# Patient Record
Sex: Female | Born: 1965 | Race: Black or African American | Hispanic: No | Smoking: Never smoker
Health system: Southern US, Community
[De-identification: ages and names within clinical notes are randomized; demographics above are authoritative.]

## PROBLEM LIST (undated history)

## (undated) DIAGNOSIS — J45909 Unspecified asthma, uncomplicated: Secondary | ICD-10-CM

## (undated) DIAGNOSIS — C801 Malignant (primary) neoplasm, unspecified: Secondary | ICD-10-CM

## (undated) HISTORY — PX: BREAST LUMPECTOMY: SHX2

## (undated) HISTORY — PX: OTHER SURGICAL HISTORY: SHX169

---

## 2009-08-25 DIAGNOSIS — C801 Malignant (primary) neoplasm, unspecified: Secondary | ICD-10-CM

## 2009-08-25 HISTORY — DX: Malignant (primary) neoplasm, unspecified: C80.1

## 2013-10-31 ENCOUNTER — Ambulatory Visit: Payer: Self-pay | Admitting: Hematology and Oncology

## 2013-11-08 ENCOUNTER — Ambulatory Visit: Payer: Self-pay | Admitting: Hematology and Oncology

## 2013-11-08 LAB — COMPREHENSIVE METABOLIC PANEL
ALK PHOS: 49 U/L
ALT: 18 U/L (ref 12–78)
Albumin: 4 g/dL (ref 3.4–5.0)
Anion Gap: 8 (ref 7–16)
BUN: 16 mg/dL (ref 7–18)
Bilirubin,Total: 0.3 mg/dL (ref 0.2–1.0)
CALCIUM: 9.3 mg/dL (ref 8.5–10.1)
Chloride: 102 mmol/L (ref 98–107)
Co2: 31 mmol/L (ref 21–32)
Creatinine: 0.69 mg/dL (ref 0.60–1.30)
EGFR (African American): >60
EGFR (Non-African Amer.): >60
Glucose: 99 mg/dL (ref 65–99)
OSMOLALITY: 282 (ref 275–301)
POTASSIUM: 3.8 mmol/L (ref 3.5–5.1)
SGOT(AST): 18 U/L (ref 15–37)
Sodium: 141 mmol/L (ref 136–145)
TOTAL PROTEIN: 7.6 g/dL (ref 6.4–8.2)

## 2013-11-08 LAB — CBC CANCER CENTER
BASOS PCT: 1 %
Basophil #: 0.1 x10 3/mm (ref 0.0–0.1)
Eosinophil #: 0.2 x10 3/mm (ref 0.0–0.7)
Eosinophil %: 3.1 %
HCT: 36.5 % (ref 35.0–47.0)
HGB: 11.2 g/dL — ABNORMAL LOW (ref 12.0–16.0)
LYMPHS PCT: 52.7 %
Lymphocyte #: 3 x10 3/mm (ref 1.0–3.6)
MCH: 20 pg — ABNORMAL LOW (ref 26.0–34.0)
MCHC: 30.7 g/dL — AB (ref 32.0–36.0)
MCV: 65 fL — ABNORMAL LOW (ref 80–100)
Monocyte #: 0.4 x10 3/mm (ref 0.2–0.9)
Monocyte %: 7.3 %
NEUTROS PCT: 35.9 %
Neutrophil #: 2 x10 3/mm (ref 1.4–6.5)
Platelet: 302 x10 3/mm (ref 150–440)
RBC: 5.6 10*6/uL — AB (ref 3.80–5.20)
RDW: 14.9 % — ABNORMAL HIGH (ref 11.5–14.5)
WBC: 5.7 x10 3/mm (ref 3.6–11.0)

## 2013-11-08 LAB — IRON AND TIBC
IRON SATURATION: 34 %
IRON: 115 ug/dL (ref 50–170)
Iron Bind.Cap.(Total): 337 ug/dL (ref 250–450)
Unbound Iron-Bind.Cap.: 222 ug/dL

## 2013-11-08 LAB — LIPASE, BLOOD: LIPASE: 134 U/L (ref 73–393)

## 2013-11-08 LAB — FERRITIN: FERRITIN (ARMC): 65 ng/mL (ref 8–388)

## 2013-11-10 LAB — CANCER ANTIGEN 27.29: CA 27.29: 16.4 U/mL (ref 0.0–38.6)

## 2013-11-23 ENCOUNTER — Ambulatory Visit: Payer: Self-pay | Admitting: Hematology and Oncology

## 2013-12-23 ENCOUNTER — Ambulatory Visit: Payer: Self-pay | Admitting: Hematology and Oncology

## 2014-01-23 ENCOUNTER — Ambulatory Visit: Payer: Self-pay | Admitting: Hematology and Oncology

## 2014-06-29 ENCOUNTER — Ambulatory Visit: Payer: Self-pay | Admitting: Hematology and Oncology

## 2015-01-15 ENCOUNTER — Emergency Department (HOSPITAL_BASED_OUTPATIENT_CLINIC_OR_DEPARTMENT_OTHER)
Admission: EM | Admit: 2015-01-15 | Discharge: 2015-01-15 | Disposition: A | Discharge status: Home / Self Care | Attending: Emergency Medicine | Admitting: Emergency Medicine

## 2015-01-15 ENCOUNTER — Emergency Department (HOSPITAL_BASED_OUTPATIENT_CLINIC_OR_DEPARTMENT_OTHER)

## 2015-01-15 ENCOUNTER — Encounter (HOSPITAL_BASED_OUTPATIENT_CLINIC_OR_DEPARTMENT_OTHER): Payer: Self-pay | Admitting: *Deleted

## 2015-01-15 DIAGNOSIS — S50311A Abrasion of right elbow, initial encounter: Secondary | ICD-10-CM | POA: Diagnosis not present

## 2015-01-15 DIAGNOSIS — K5909 Other constipation: Secondary | ICD-10-CM | POA: Diagnosis not present

## 2015-01-15 DIAGNOSIS — Z853 Personal history of malignant neoplasm of breast: Secondary | ICD-10-CM | POA: Insufficient documentation

## 2015-01-15 DIAGNOSIS — S40211A Abrasion of right shoulder, initial encounter: Secondary | ICD-10-CM | POA: Diagnosis not present

## 2015-01-15 DIAGNOSIS — J45909 Unspecified asthma, uncomplicated: Secondary | ICD-10-CM | POA: Insufficient documentation

## 2015-01-15 DIAGNOSIS — Y92410 Unspecified street and highway as the place of occurrence of the external cause: Secondary | ICD-10-CM | POA: Diagnosis not present

## 2015-01-15 DIAGNOSIS — Z3202 Encounter for pregnancy test, result negative: Secondary | ICD-10-CM | POA: Diagnosis not present

## 2015-01-15 DIAGNOSIS — Y9389 Activity, other specified: Secondary | ICD-10-CM | POA: Diagnosis not present

## 2015-01-15 DIAGNOSIS — R1084 Generalized abdominal pain: Secondary | ICD-10-CM | POA: Diagnosis present

## 2015-01-15 DIAGNOSIS — Y998 Other external cause status: Secondary | ICD-10-CM | POA: Diagnosis not present

## 2015-01-15 DIAGNOSIS — R109 Unspecified abdominal pain: Secondary | ICD-10-CM

## 2015-01-15 HISTORY — DX: Unspecified asthma, uncomplicated: J45.909

## 2015-01-15 HISTORY — DX: Malignant (primary) neoplasm, unspecified: C80.1

## 2015-01-15 LAB — COMPREHENSIVE METABOLIC PANEL
ALT: 20 U/L (ref 14–54)
AST: 23 U/L (ref 15–41)
Albumin: 4.3 g/dL (ref 3.5–5.0)
Alkaline Phosphatase: 42 U/L (ref 38–126)
Anion gap: 9 (ref 5–15)
BUN: 13 mg/dL (ref 6–20)
CHLORIDE: 101 mmol/L (ref 101–111)
CO2: 29 mmol/L (ref 22–32)
CREATININE: 0.63 mg/dL (ref 0.44–1.00)
Calcium: 9.6 mg/dL (ref 8.9–10.3)
GFR calc Af Amer: >60 mL/min (ref >60)
GFR calc non Af Amer: >60 mL/min (ref >60)
GLUCOSE: 117 mg/dL — AB (ref 65–99)
Potassium: 3.5 mmol/L (ref 3.5–5.1)
Sodium: 139 mmol/L (ref 135–145)
Total Bilirubin: 0.9 mg/dL (ref 0.3–1.2)
Total Protein: 7 g/dL (ref 6.5–8.1)

## 2015-01-15 LAB — CBC WITH DIFFERENTIAL/PLATELET
BASOS PCT: 1 % (ref 0–1)
Basophils Absolute: 0 10*3/uL (ref 0.0–0.1)
Eosinophils Absolute: 0.3 10*3/uL (ref 0.0–0.7)
Eosinophils Relative: 6 % — ABNORMAL HIGH (ref 0–5)
HCT: 34 % — ABNORMAL LOW (ref 36.0–46.0)
HEMOGLOBIN: 11.2 g/dL — AB (ref 12.0–15.0)
LYMPHS PCT: 35 % (ref 12–46)
Lymphs Abs: 1.7 10*3/uL (ref 0.7–4.0)
MCH: 21.1 pg — ABNORMAL LOW (ref 26.0–34.0)
MCHC: 32.9 g/dL (ref 30.0–36.0)
MCV: 63.9 fL — ABNORMAL LOW (ref 78.0–100.0)
Monocytes Absolute: 0.3 10*3/uL (ref 0.1–1.0)
Monocytes Relative: 6 % (ref 3–12)
Neutro Abs: 2.5 10*3/uL (ref 1.7–7.7)
Neutrophils Relative %: 52 % (ref 43–77)
Platelets: 286 10*3/uL (ref 150–400)
RBC: 5.32 MIL/uL — ABNORMAL HIGH (ref 3.87–5.11)
RDW: 14.6 % (ref 11.5–15.5)
WBC: 4.8 10*3/uL (ref 4.0–10.5)

## 2015-01-15 LAB — PREGNANCY, URINE: Preg Test, Ur: NEGATIVE

## 2015-01-15 LAB — URINALYSIS, ROUTINE W REFLEX MICROSCOPIC
BILIRUBIN URINE: NEGATIVE
Glucose, UA: NEGATIVE mg/dL
Hgb urine dipstick: NEGATIVE
KETONES UR: NEGATIVE mg/dL
Leukocytes, UA: NEGATIVE
Nitrite: NEGATIVE
PH: 7 (ref 5.0–8.0)
PROTEIN: NEGATIVE mg/dL
Specific Gravity, Urine: 1.016 (ref 1.005–1.030)
Urobilinogen, UA: 0.2 mg/dL (ref 0.0–1.0)

## 2015-01-15 LAB — LIPASE, BLOOD: LIPASE: 28 U/L (ref 22–51)

## 2015-01-15 MED ORDER — IOHEXOL 300 MG/ML  SOLN
100.0000 mL | Freq: Once | INTRAMUSCULAR | Status: AC | PRN
  Administered 2015-01-15: 100 mL via INTRAVENOUS

## 2015-01-15 NOTE — ED Notes (Signed)
Pt reports abdominal cramping, nausea, HA and back pain since 11am today.  No distress noted in triage.

## 2015-01-15 NOTE — Discharge Instructions (Signed)
Follow up with your doctor as discussed for the findings suggested Abdominal Pain Many things can cause abdominal pain. Usually, abdominal pain is not caused by a disease and will improve without treatment. It can often be observed and treated at home. Your health care provider will do a physical exam and possibly order blood tests and X-rays to help determine the seriousness of your pain. However, in many cases, more time must pass before a clear cause of the pain can be found. Before that point, your health care provider may not know if you need more testing or further treatment. HOME CARE INSTRUCTIONS  Monitor your abdominal pain for any changes. The following actions may help to alleviate any discomfort you are experiencing:  Only take over-the-counter or prescription medicines as directed by your health care provider.  Do not take laxatives unless directed to do so by your health care provider.  Try a clear liquid diet (broth, tea, or water) as directed by your health care provider. Slowly move to a bland diet as tolerated. SEEK MEDICAL CARE IF:  You have unexplained abdominal pain.  You have abdominal pain associated with nausea or diarrhea.  You have pain when you urinate or have a bowel movement.  You experience abdominal pain that wakes you in the night.  You have abdominal pain that is worsened or improved by eating food.  You have abdominal pain that is worsened with eating fatty foods.  You have a fever. SEEK IMMEDIATE MEDICAL CARE IF:   Your pain does not go away within 2 hours.  You keep throwing up (vomiting).  Your pain is felt only in portions of the abdomen, such as the right side or the left lower portion of the abdomen.  You pass bloody or black tarry stools. MAKE SURE YOU:  Understand these instructions.   Will watch your condition.   Will get help right away if you are not doing well or get worse.  Document Released: 05/21/2005 Document Revised:  08/16/2013 Document Reviewed: 04/20/2013 [facility] Patient Information 2015 [place], [place]. This information is not intended to replace advice given to you by your health care provider. Make sure you discuss any questions you have with your health care provider.
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
## 2015-05-25 ENCOUNTER — Emergency Department: Admitting: Anesthesiology

## 2015-05-25 ENCOUNTER — Emergency Department

## 2015-05-25 ENCOUNTER — Encounter: Payer: Self-pay | Admitting: Emergency Medicine

## 2015-05-25 ENCOUNTER — Encounter
Admission: EM | Disposition: A | Discharge status: Home / Self Care | Payer: Self-pay | Source: Home / Self Care | Attending: Surgery

## 2015-05-25 ENCOUNTER — Inpatient Hospital Stay
Admission: EM | Admit: 2015-05-25 | Discharge: 2015-05-27 | DRG: 340 | Disposition: A | Discharge status: Home / Self Care | Attending: Surgery | Admitting: Surgery

## 2015-05-25 DIAGNOSIS — K353 Acute appendicitis with localized peritonitis, without perforation or gangrene: Secondary | ICD-10-CM

## 2015-05-25 DIAGNOSIS — D649 Anemia, unspecified: Secondary | ICD-10-CM | POA: Diagnosis present

## 2015-05-25 DIAGNOSIS — R102 Pelvic and perineal pain: Secondary | ICD-10-CM | POA: Diagnosis present

## 2015-05-25 DIAGNOSIS — K3580 Unspecified acute appendicitis: Secondary | ICD-10-CM

## 2015-05-25 DIAGNOSIS — Z853 Personal history of malignant neoplasm of breast: Secondary | ICD-10-CM | POA: Diagnosis not present

## 2015-05-25 DIAGNOSIS — J45909 Unspecified asthma, uncomplicated: Secondary | ICD-10-CM | POA: Diagnosis present

## 2015-05-25 DIAGNOSIS — Z923 Personal history of irradiation: Secondary | ICD-10-CM | POA: Diagnosis not present

## 2015-05-25 DIAGNOSIS — K3533 Acute appendicitis with perforation and localized peritonitis, with abscess: Secondary | ICD-10-CM | POA: Diagnosis present

## 2015-05-25 DIAGNOSIS — Z9221 Personal history of antineoplastic chemotherapy: Secondary | ICD-10-CM | POA: Diagnosis not present

## 2015-05-25 HISTORY — PX: LAPAROSCOPIC APPENDECTOMY: SHX408

## 2015-05-25 LAB — COMPREHENSIVE METABOLIC PANEL
ALT: 16 U/L (ref 14–54)
ANION GAP: 6 (ref 5–15)
AST: 19 U/L (ref 15–41)
Albumin: 3.4 g/dL — ABNORMAL LOW (ref 3.5–5.0)
Alkaline Phosphatase: 45 U/L (ref 38–126)
BUN: 13 mg/dL (ref 6–20)
CHLORIDE: 103 mmol/L (ref 101–111)
CO2: 28 mmol/L (ref 22–32)
CREATININE: 0.51 mg/dL (ref 0.44–1.00)
Calcium: 8.7 mg/dL — ABNORMAL LOW (ref 8.9–10.3)
Glucose, Bld: 92 mg/dL (ref 65–99)
Potassium: 3.9 mmol/L (ref 3.5–5.1)
Sodium: 137 mmol/L (ref 135–145)
Total Bilirubin: 0.6 mg/dL (ref 0.3–1.2)
Total Protein: 6.4 g/dL — ABNORMAL LOW (ref 6.5–8.1)

## 2015-05-25 LAB — CHLAMYDIA/NGC RT PCR (ARMC ONLY)
Chlamydia Tr: NOT DETECTED
N gonorrhoeae: NOT DETECTED

## 2015-05-25 LAB — URINALYSIS COMPLETE WITH MICROSCOPIC (ARMC ONLY)
BILIRUBIN URINE: NEGATIVE
GLUCOSE, UA: NEGATIVE mg/dL
HGB URINE DIPSTICK: NEGATIVE
Ketones, ur: NEGATIVE mg/dL
Leukocytes, UA: NEGATIVE
NITRITE: NEGATIVE
Protein, ur: 100 mg/dL — AB
Specific Gravity, Urine: 1.024 (ref 1.005–1.030)
pH: 8 (ref 5.0–8.0)

## 2015-05-25 LAB — WET PREP, GENITAL
Clue Cells Wet Prep HPF POC: NONE SEEN
TRICH WET PREP: NONE SEEN
Yeast Wet Prep HPF POC: NONE SEEN

## 2015-05-25 LAB — CBC
HCT: 32.8 % — ABNORMAL LOW (ref 35.0–47.0)
HCT: 31.1 % — ABNORMAL LOW (ref 35.0–47.0)
Hemoglobin: 10.4 g/dL — ABNORMAL LOW (ref 12.0–16.0)
Hemoglobin: 9.8 g/dL — ABNORMAL LOW (ref 12.0–16.0)
MCH: 21.1 pg — ABNORMAL LOW (ref 26.0–34.0)
MCH: 20.7 pg — AB (ref 26.0–34.0)
MCHC: 31.7 g/dL — AB (ref 32.0–36.0)
MCHC: 31.5 g/dL — ABNORMAL LOW (ref 32.0–36.0)
MCV: 66.6 fL — AB (ref 80.0–100.0)
MCV: 65.6 fL — AB (ref 80.0–100.0)
PLATELETS: 296 10*3/uL (ref 150–440)
Platelets: 324 10*3/uL (ref 150–440)
RBC: 4.92 MIL/uL (ref 3.80–5.20)
RBC: 4.74 MIL/uL (ref 3.80–5.20)
RDW: 14.5 % (ref 11.5–14.5)
RDW: 14.4 % (ref 11.5–14.5)
WBC: 10.8 10*3/uL (ref 3.6–11.0)
WBC: 12.7 10*3/uL — ABNORMAL HIGH (ref 3.6–11.0)

## 2015-05-25 LAB — CREATININE, SERUM
Creatinine, Ser: 0.54 mg/dL (ref 0.44–1.00)
GFR calc Af Amer: >60 mL/min (ref >60)
GFR calc non Af Amer: >60 mL/min (ref >60)

## 2015-05-25 LAB — LIPASE, BLOOD: Lipase: 20 U/L — ABNORMAL LOW (ref 22–51)

## 2015-05-25 LAB — POCT PREGNANCY, URINE: Preg Test, Ur: NEGATIVE

## 2015-05-25 SURGERY — APPENDECTOMY, LAPAROSCOPIC
Anesthesia: General | Wound class: Contaminated

## 2015-05-25 MED ORDER — LIDOCAINE HCL (CARDIAC) 20 MG/ML IV SOLN
INTRAVENOUS | Status: DC | PRN
  Administered 2015-05-25: 60 mg via INTRAVENOUS

## 2015-05-25 MED ORDER — BUPIVACAINE-EPINEPHRINE (PF) 0.25% -1:200000 IJ SOLN
INTRAMUSCULAR | Status: DC | PRN
  Administered 2015-05-25: 30 mL via PERINEURAL

## 2015-05-25 MED ORDER — LACTATED RINGERS IV SOLN
INTRAVENOUS | Status: DC
  Administered 2015-05-25: 15:00:00 via INTRAVENOUS

## 2015-05-25 MED ORDER — NEOSTIGMINE METHYLSULFATE 10 MG/10ML IV SOLN
INTRAVENOUS | Status: DC | PRN
  Administered 2015-05-25: 3 mg via INTRAVENOUS

## 2015-05-25 MED ORDER — DEXAMETHASONE SODIUM PHOSPHATE 10 MG/ML IJ SOLN
INTRAMUSCULAR | Status: DC | PRN
  Administered 2015-05-25: 5 mg via INTRAVENOUS

## 2015-05-25 MED ORDER — IOHEXOL 300 MG/ML  SOLN
100.0000 mL | Freq: Once | INTRAMUSCULAR | Status: AC | PRN
  Administered 2015-05-25: 100 mL via INTRAVENOUS

## 2015-05-25 MED ORDER — ONDANSETRON HCL 4 MG/2ML IJ SOLN: 4.0000 mg | Freq: Four times a day (QID) | INTRAMUSCULAR | Status: DC | PRN

## 2015-05-25 MED ORDER — HEPARIN SODIUM (PORCINE) 5000 UNIT/ML IJ SOLN: 5000.0000 [IU] | Freq: Three times a day (TID) | INTRAMUSCULAR | Status: DC

## 2015-05-25 MED ORDER — HYDROMORPHONE HCL 1 MG/ML IJ SOLN
0.2500 mg | INTRAMUSCULAR | Status: DC | PRN
  Administered 2015-05-25 (×2): 0.5 mg via INTRAVENOUS

## 2015-05-25 MED ORDER — ATROPINE SULFATE 0.4 MG/ML IJ SOLN
INTRAMUSCULAR | Status: DC | PRN
  Administered 2015-05-25: 0.2 mg via INTRAVENOUS

## 2015-05-25 MED ORDER — DEXTROSE 5 % IV SOLN
2.0000 g | Freq: Once | INTRAVENOUS | Status: AC
  Administered 2015-05-25: 2 g via INTRAVENOUS
  Filled 2015-05-25: qty 2

## 2015-05-25 MED ORDER — FENTANYL CITRATE (PF) 100 MCG/2ML IJ SOLN
INTRAMUSCULAR | Status: DC | PRN
  Administered 2015-05-25: 100 ug via INTRAVENOUS

## 2015-05-25 MED ORDER — HEPARIN SODIUM (PORCINE) 5000 UNIT/ML IJ SOLN
5000.0000 [IU] | Freq: Three times a day (TID) | INTRAMUSCULAR | Status: DC
  Administered 2015-05-25 – 2015-05-27 (×6): 5000 [IU] via SUBCUTANEOUS
  Filled 2015-05-25 (×5): qty 1

## 2015-05-25 MED ORDER — OXYCODONE HCL 5 MG PO TABS
5.0000 mg | ORAL_TABLET | ORAL | Status: DC | PRN
  Administered 2015-05-26 (×2): 5 mg via ORAL
  Filled 2015-05-25 (×2): qty 1

## 2015-05-25 MED ORDER — PROPOFOL 10 MG/ML IV BOLUS
INTRAVENOUS | Status: DC | PRN
  Administered 2015-05-25: 150 mg via INTRAVENOUS

## 2015-05-25 MED ORDER — DOXYCYCLINE HYCLATE 100 MG PO TABS
100.0000 mg | ORAL_TABLET | Freq: Two times a day (BID) | ORAL | Status: DC
  Administered 2015-05-25 – 2015-05-27 (×4): 100 mg via ORAL
  Filled 2015-05-25 (×4): qty 1

## 2015-05-25 MED ORDER — IOHEXOL 240 MG/ML SOLN
25.0000 mL | Freq: Once | INTRAMUSCULAR | Status: AC | PRN
Start: 2015-05-25 — End: 2015-05-25
  Administered 2015-05-25: 25 mL via ORAL

## 2015-05-25 MED ORDER — ONDANSETRON HCL 4 MG/2ML IJ SOLN
INTRAMUSCULAR | Status: DC | PRN
  Administered 2015-05-25: 4 mg via INTRAVENOUS

## 2015-05-25 MED ORDER — ONDANSETRON HCL 4 MG PO TABS: 4.0000 mg | ORAL_TABLET | Freq: Four times a day (QID) | ORAL | Status: DC | PRN

## 2015-05-25 MED ORDER — DEXTROSE 5 % IV SOLN
1.0000 g | Freq: Four times a day (QID) | INTRAVENOUS | Status: DC
  Administered 2015-05-25 – 2015-05-27 (×6): 1 g via INTRAVENOUS
  Filled 2015-05-25 (×11): qty 1

## 2015-05-25 MED ORDER — LACTATED RINGERS IV SOLN
INTRAVENOUS | Status: DC
  Administered 2015-05-25 – 2015-05-26 (×3): via INTRAVENOUS
  Administered 2015-05-26 – 2015-05-27 (×2): 1 mL via INTRAVENOUS

## 2015-05-25 MED ORDER — MORPHINE SULFATE (PF) 2 MG/ML IV SOLN
2.0000 mg | INTRAVENOUS | Status: DC | PRN
  Administered 2015-05-26 – 2015-05-27 (×3): 2 mg via INTRAVENOUS
  Filled 2015-05-25 (×3): qty 1

## 2015-05-25 MED ORDER — KETOROLAC TROMETHAMINE 30 MG/ML IJ SOLN
INTRAMUSCULAR | Status: DC | PRN
  Administered 2015-05-25: 30 mg via INTRAVENOUS

## 2015-05-25 MED ORDER — GLYCOPYRROLATE 0.2 MG/ML IJ SOLN
INTRAMUSCULAR | Status: DC | PRN
  Administered 2015-05-25: .6 mg via INTRAVENOUS
  Administered 2015-05-25: 0.2 mg via INTRAVENOUS

## 2015-05-25 MED ORDER — PROMETHAZINE HCL 25 MG/ML IJ SOLN: 6.2500 mg | INTRAMUSCULAR | Status: DC | PRN

## 2015-05-25 MED ORDER — MIDAZOLAM HCL 5 MG/5ML IJ SOLN
INTRAMUSCULAR | Status: DC | PRN
  Administered 2015-05-25: 2 mg via INTRAVENOUS

## 2015-05-25 MED ORDER — SUCCINYLCHOLINE CHLORIDE 20 MG/ML IJ SOLN
INTRAMUSCULAR | Status: DC | PRN
  Administered 2015-05-25: 100 mg via INTRAVENOUS

## 2015-05-25 MED ORDER — ROCURONIUM BROMIDE 100 MG/10ML IV SOLN
INTRAVENOUS | Status: DC | PRN
  Administered 2015-05-25: 25 mg via INTRAVENOUS
  Administered 2015-05-25: 5 mg via INTRAVENOUS

## 2015-05-25 MED ORDER — HYDROMORPHONE HCL 1 MG/ML IJ SOLN
INTRAMUSCULAR | Status: AC
  Filled 2015-05-25: qty 1

## 2015-05-25 MED ORDER — HEPARIN SODIUM (PORCINE) 5000 UNIT/ML IJ SOLN
INTRAMUSCULAR | Status: AC
Start: 2015-05-25 — End: 2015-05-26
  Filled 2015-05-25: qty 1

## 2015-05-25 MED ORDER — LACTATED RINGERS IV BOLUS (SEPSIS)
500.0000 mL | Freq: Once | INTRAVENOUS | Status: AC
  Administered 2015-05-25: 500 mL via INTRAVENOUS

## 2015-05-25 SURGICAL SUPPLY — 42 items
ADHESIVE MASTISOL STRL (MISCELLANEOUS) ×3 IMPLANT
APPLIER CLIP ROT 10 11.4 M/L (STAPLE) ×3
BLADE SURG SZ11 CARB STEEL (BLADE) IMPLANT
CANISTER SUCT 3000ML (MISCELLANEOUS) ×3 IMPLANT
CATH TRAY 16F METER LATEX (MISCELLANEOUS) ×3 IMPLANT
CHLORAPREP W/TINT 26ML (MISCELLANEOUS) ×3 IMPLANT
CLIP APPLIE ROT 10 11.4 M/L (STAPLE) ×1 IMPLANT
CLOSURE WOUND 1/2 X4 (GAUZE/BANDAGES/DRESSINGS) ×1
CUTTER LINEAR ENDO 35 ART THIN (STAPLE) ×3 IMPLANT
DECANTER SPIKE VIAL GLASS SM (MISCELLANEOUS) ×3 IMPLANT
DEVICE TROCAR PUNCTURE CLOSURE (ENDOMECHANICALS) ×3 IMPLANT
ENDOPOUCH RETRIEVER 10 (MISCELLANEOUS) ×3 IMPLANT
GAUZE SPONGE NON-WVN 2X2 STRL (MISCELLANEOUS) ×6 IMPLANT
GLOVE BIO SURGEON STRL SZ8 (GLOVE) ×6 IMPLANT
GOWN STRL REUS W/ TWL LRG LVL3 (GOWN DISPOSABLE) ×2 IMPLANT
GOWN STRL REUS W/TWL LRG LVL3 (GOWN DISPOSABLE) ×4
IRRIGATION STRYKERFLOW (MISCELLANEOUS) ×1 IMPLANT
IRRIGATOR STRYKERFLOW (MISCELLANEOUS) ×3
KIT RM TURNOVER STRD PROC AR (KITS) ×3 IMPLANT
LABEL OR SOLS (LABEL) IMPLANT
NDL SAFETY 22GX1.5 (NEEDLE) ×3 IMPLANT
NEEDLE VERESS 14GA 120MM (NEEDLE) ×3 IMPLANT
NS IRRIG 500ML POUR BTL (IV SOLUTION) ×3 IMPLANT
PACK LAP CHOLECYSTECTOMY (MISCELLANEOUS) ×3 IMPLANT
PAD GROUND ADULT SPLIT (MISCELLANEOUS) ×3 IMPLANT
RELOAD /EVU35 (ENDOMECHANICALS) IMPLANT
RELOAD CUTTER ETS 35MM STAND (ENDOMECHANICALS) ×3 IMPLANT
SCISSORS METZENBAUM CVD 33 (INSTRUMENTS) ×3 IMPLANT
SLEEVE ENDOPATH XCEL 5M (ENDOMECHANICALS) ×3 IMPLANT
SOL .9 NS 3000ML IRR  AL (IV SOLUTION) ×2
SOL .9 NS 3000ML IRR UROMATIC (IV SOLUTION) ×1 IMPLANT
SPONGE LAP 18X18 5 PK (GAUZE/BANDAGES/DRESSINGS) ×3 IMPLANT
SPONGE VERSALON 2X2 STRL (MISCELLANEOUS) ×12
STRIP CLOSURE SKIN 1/2X4 (GAUZE/BANDAGES/DRESSINGS) ×2 IMPLANT
SUT MNCRL 4-0 (SUTURE)
SUT MNCRL 4-0 27XMFL (SUTURE)
SUT VICRYL 0 TIES 12 18 (SUTURE) ×3 IMPLANT
SUTURE MNCRL 4-0 27XMF (SUTURE) IMPLANT
TRAP SPECIMEN MUCOUS 40CC (MISCELLANEOUS) IMPLANT
TROCAR XCEL 12X100 BLDLESS (ENDOMECHANICALS) ×3 IMPLANT
TROCAR XCEL NON-BLD 5MMX100MML (ENDOMECHANICALS) ×3 IMPLANT
TUBING INSUFFLATOR HI FLOW (MISCELLANEOUS) ×3 IMPLANT

## 2015-05-25 NOTE — ED Notes (Addendum)
Pt states she has been having lower abdominal pain for the past week.  Pain increased over night.  Reports having nausea.  Tender to touch.  Last BM 05/25/15. Denies any diarrhea. Nausea started yesterday along with headache. Has been eating and drinking. Patient reports port recently removed as well.

## 2015-05-25 NOTE — ED Notes (Signed)
Patient transported to CT 

## 2015-05-25 NOTE — ED Notes (Signed)
Urine preg negative

## 2015-05-25 NOTE — H&P (Signed)
Bridget Medina is an 49 y.o. female.    Chief Complaint: Right lower quadrant abdominal pain  HPI: This a patient with approximately 2 weeks of abdominal pain she states that it started getting bad last Sunday 5 or 6 days ago however she admits that it was bothering her for at least a week before that was quite minimal before Sunday. Spent the entire day in bed on Sunday he's been able to work this week. She has nausea but no emesis denies melena or hematochezia has had normal bowel movements without diarrhea and no fevers or chills she's never had an episode like this before denies dysuria.  Past Medical History  Diagnosis Date  . Asthma   . Cancer 2011    breast    Past Surgical History  Procedure Laterality Date  . Breast lumpectomy Right     Lumpectomy  . Ivad removal      History reviewed. No pertinent family history. Social History:  reports that she has never smoked. She does not have any smokeless tobacco history on file. She reports that she does not drink alcohol or use illicit drugs.  Allergies: No Known Allergies   (Not in a hospital admission)   Review of Systems  Constitutional: Negative for fever and chills.  HENT: Negative.   Eyes: Negative.   Respiratory: Negative.   Cardiovascular: Negative.   Gastrointestinal: Positive for nausea and abdominal pain. Negative for heartburn, vomiting, diarrhea, constipation, blood in stool and melena.  Genitourinary: Negative for dysuria, urgency and frequency.  Musculoskeletal: Negative.  Negative for myalgias.  Skin: Negative.   Neurological: Negative.  Negative for weakness.  Endo/Heme/Allergies: Negative.   Psychiatric/Behavioral: Negative.      Physical Exam:  BP 108/68 mmHg  Pulse 58  Temp(Src) 97.8 F (36.6 C) (Oral)  Resp 18  Ht 5' 4"  (1.626 m)  Wt 122 lb (55.339 kg)  BMI 20.93 kg/m2  SpO2 100%  Physical Exam  Constitutional: She is oriented to person, place, and time and well-developed,  well-nourished, and in no distress. No distress.  HENT:  Head: Normocephalic and atraumatic.  Eyes: No scleral icterus.  Neck: Normal range of motion. Neck supple.  Cardiovascular: Normal rate, regular rhythm and normal heart sounds.   Pulmonary/Chest: Effort normal and breath sounds normal. No respiratory distress. She has no wheezes. She has no rales.  Abdominal: She exhibits distension. There is tenderness. There is rebound and guarding.  Percussion tenderness in maximal tenderness in the right lower quadrant with a positive Rovsing sign  Musculoskeletal: Normal range of motion. She exhibits no edema.  Lymphadenopathy:    She has no cervical adenopathy.  Neurological: She is alert and oriented to person, place, and time.  Skin: Skin is warm and dry. No erythema.  Psychiatric: Mood, affect and judgment normal.        Results for orders placed or performed during the hospital encounter of 05/25/15 (from the past 48 hour(s))  CBC     Status: Abnormal   Collection Time: 05/25/15  7:55 AM  Result Value Ref Range   WBC 10.8 3.6 - 11.0 K/uL   RBC 4.92 3.80 - 5.20 MIL/uL   Hemoglobin 10.4 (L) 12.0 - 16.0 g/dL   HCT 32.8 (L) 35.0 - 47.0 %   MCV 66.6 (L) 80.0 - 100.0 fL   MCH 21.1 (L) 26.0 - 34.0 pg   MCHC 31.7 (L) 32.0 - 36.0 g/dL   RDW 14.5 11.5 - 14.5 %   Platelets 324 150 -  440 K/uL  Comprehensive metabolic panel     Status: Abnormal   Collection Time: 05/25/15  7:55 AM  Result Value Ref Range   Sodium 137 135 - 145 mmol/L   Potassium 3.9 3.5 - 5.1 mmol/L   Chloride 103 101 - 111 mmol/L   CO2 28 22 - 32 mmol/L   Glucose, Bld 92 65 - 99 mg/dL   BUN 13 6 - 20 mg/dL   Creatinine, Ser 0.51 0.44 - 1.00 mg/dL   Calcium 8.7 (L) 8.9 - 10.3 mg/dL   Total Protein 6.4 (L) 6.5 - 8.1 g/dL   Albumin 3.4 (L) 3.5 - 5.0 g/dL   AST 19 15 - 41 U/L   ALT 16 14 - 54 U/L   Alkaline Phosphatase 45 38 - 126 U/L   Total Bilirubin 0.6 0.3 - 1.2 mg/dL   GFR calc non Af Amer >60 >60 mL/min   GFR  calc Af Amer >60 >60 mL/min    Comment: (NOTE) The eGFR has been calculated using the CKD EPI equation. This calculation has not been validated in all clinical situations. eGFR's persistently <60 mL/min signify possible Chronic Kidney Disease.    Anion gap 6 5 - 15  Lipase, blood     Status: Abnormal   Collection Time: 05/25/15  7:55 AM  Result Value Ref Range   Lipase 20 (L) 22 - 51 U/L  Pregnancy, urine POC     Status: None   Collection Time: 05/25/15  8:11 AM  Result Value Ref Range   Preg Test, Ur NEGATIVE NEGATIVE    Comment:        THE SENSITIVITY OF THIS METHODOLOGY IS >24 mIU/mL   Urinalysis complete, with microscopic ([facility] only)     Status: Abnormal   Collection Time: 05/25/15  8:15 AM  Result Value Ref Range   Color, Urine YELLOW (A) YELLOW   APPearance CLEAR (A) CLEAR   Glucose, UA NEGATIVE NEGATIVE mg/dL   Bilirubin Urine NEGATIVE NEGATIVE   Ketones, ur NEGATIVE NEGATIVE mg/dL   Specific Gravity, Urine 1.024 1.005 - 1.030   Hgb urine dipstick NEGATIVE NEGATIVE   pH 8.0 5.0 - 8.0   Protein, ur 100 (A) NEGATIVE mg/dL   Nitrite NEGATIVE NEGATIVE   Leukocytes, UA NEGATIVE NEGATIVE   RBC / HPF 0-5 0 - 5 RBC/hpf   WBC, UA 0-5 0 - 5 WBC/hpf   Bacteria, UA RARE (A) NONE SEEN   Squamous Epithelial / LPF 0-5 (A) NONE SEEN   Mucous PRESENT    Hyaline Casts, UA PRESENT   Wet prep, genital     Status: Abnormal   Collection Time: 05/25/15 11:47 AM  Result Value Ref Range   Yeast Wet Prep HPF POC NONE SEEN NONE SEEN   Trich, Wet Prep NONE SEEN NONE SEEN   Clue Cells Wet Prep HPF POC NONE SEEN NONE SEEN   WBC, Wet Prep HPF POC FEW (A) NONE SEEN  Chlamydia/NGC rt PCR ([facility] only)     Status: None   Collection Time: 05/25/15 11:47 AM  Result Value Ref Range   Specimen source GC/Chlam CERVIX    Chlamydia Tr NOT DETECTED NOT DETECTED   N gonorrhoeae NOT DETECTED NOT DETECTED    Comment: (NOTE) 100  This methodology has not been evaluated in pregnant women or in 200   patients with a history of hysterectomy. 300 400  This methodology will not be performed on patients less than 34  years of age.    US  Transvaginal Non-ob  05/25/2015   CLINICAL DATA:  Pelvic pain for 1 week.  Nausea and tenderness.  EXAM: TRANSABDOMINAL AND TRANSVAGINAL ULTRASOUND OF PELVIS  TECHNIQUE: Both transabdominal and transvaginal ultrasound examinations of the pelvis were performed. Transabdominal technique was performed for global imaging of the pelvis including uterus, ovaries, adnexal regions, and pelvic cul-de-sac. It was necessary to proceed with endovaginal exam following the transabdominal exam to visualize the endometrial stripe and ovaries.  COMPARISON:  CT on 05/25/2015  FINDINGS: Uterus  Measurements: 10.0 x 6.7 x 6.0 cm. Retroverted. No fibroids or other mass visualized.  Endometrium  Thickness: 10 mm.  No focal abnormality visualized.  Right ovary  Measurements: No normal ovary visualized. A complex hypoechoic mass is seen in the right adnexa which measures approximately 7.8 x 4.6 x 6.8 cm. This shows areas of internal blood flow on color Doppler ultrasound.  Left ovary  Measurements: 3.0 x 2.0 x 2.8 cm. 2.3 cm follicle noted. Otherwise normal appearance/no adnexal mass.  Other findings  Small amount of free fluid noted.  IMPRESSION: Complex hypoechoic mass in right adnexa measures 7.8 cm. This has nonspecific features, but is suspicious for an inflammatory mass, with differential diagnosis including pelvic inflammatory disease and appendicitis. Neoplasm is considered much less likely based on recent CT appearance.  Normal appearance of uterus and left ovary.   Electronically Signed   By: Earle Gell M.D.   On: 05/25/2015 13:05   US Pelvis Complete  05/25/2015   CLINICAL DATA:  Pelvic pain for 1 week.  Nausea and tenderness.  EXAM: TRANSABDOMINAL AND TRANSVAGINAL ULTRASOUND OF PELVIS  TECHNIQUE: Both transabdominal and transvaginal ultrasound examinations of the pelvis were  performed. Transabdominal technique was performed for global imaging of the pelvis including uterus, ovaries, adnexal regions, and pelvic cul-de-sac. It was necessary to proceed with endovaginal exam following the transabdominal exam to visualize the endometrial stripe and ovaries.  COMPARISON:  CT on 05/25/2015  FINDINGS: Uterus  Measurements: 10.0 x 6.7 x 6.0 cm. Retroverted. No fibroids or other mass visualized.  Endometrium  Thickness: 10 mm.  No focal abnormality visualized.  Right ovary  Measurements: No normal ovary visualized. A complex hypoechoic mass is seen in the right adnexa which measures approximately 7.8 x 4.6 x 6.8 cm. This shows areas of internal blood flow on color Doppler ultrasound.  Left ovary  Measurements: 3.0 x 2.0 x 2.8 cm. 2.3 cm follicle noted. Otherwise normal appearance/no adnexal mass.  Other findings  Small amount of free fluid noted.  IMPRESSION: Complex hypoechoic mass in right adnexa measures 7.8 cm. This has nonspecific features, but is suspicious for an inflammatory mass, with differential diagnosis including pelvic inflammatory disease and appendicitis. Neoplasm is considered much less likely based on recent CT appearance.  Normal appearance of uterus and left ovary.   Electronically Signed   By: Earle Gell M.D.   On: 05/25/2015 13:05   Ct Abdomen Pelvis W Contrast  05/25/2015   CLINICAL DATA:  Worsening lower abdominal and pelvic pain for 1 week. Lower abdominal tenderness. Nausea.  EXAM: CT ABDOMEN AND PELVIS WITH CONTRAST  TECHNIQUE: Multidetector CT imaging of the abdomen and pelvis was performed using the standard protocol following bolus administration of intravenous contrast.  CONTRAST:  121m OMNIPAQUE IOHEXOL 300 MG/ML  SOLN  COMPARISON:  None.  FINDINGS: Lower chest:  No acute findings.  Hepatobiliary: No masses or other significant abnormality. Gallbladder is unremarkable.  Pancreas: No mass, inflammatory changes, or other significant abnormality.  Spleen: No  evidence of splenomegaly. Tiny less than 1 cm splenic lesions are seen which are too small to characterize but most likely benign.  Adrenals/Urinary Tract: No masses identified. No evidence of hydronephrosis.  Stomach/Bowel: No evidence of bowel obstruction. Wall thickening is seen involving the sigmoid colon, which is likely reactive in etiology due to right pelvic inflammatory process described below.  Vascular/Lymphatic: No pathologically enlarged lymph nodes. No evidence of abdominal aortic aneurysm.  Reproductive: No mass or other significant abnormality.  Other: Inflammatory changes and mild mesenteric lymphadenopathy is seen in right lower quadrant and right adnexa. This obscures normal anatomic landmarks. The appendix is difficult to visualize, but there is a somewhat linear soft tissue density with a tiny calcification on image 57/series 2 which is suspicious for an appendicolith. There is a small rim enhancing fluid collection in the right adnexa measuring 2.5 cm. Differential diagnosis include ruptured appendicitis, and pelvic inflammatory disease with tubo-ovarian abscess.  Musculoskeletal:  No suspicious bone lesions identified.  IMPRESSION: Right lower quadrant and right adnexal inflammatory process, with 2.5 cm rim enhancing fluid collection. Appendix is not well visualized but there is a possible tiny appendicular left in the area of inflammatory changes. Differential diagnosis includes ruptured appendicitis with small abscess, and pelvic inflammatory disease with small tubo-ovarian abscess.   Electronically Signed   By: Earle Gell M.D.   On: 05/25/2015 11:03     Assessment/Plan  CT ultrasound personally reviewed as were the labs history is consistent with a possible ruptured appendix with nearly 2 weeks of right lower quadrant pain worsening 5 days ago and CT scan shows a flow enhancing fluid collection in the right lower quadrant the appendix somewhat disappears near this section. With that  in mind of recommended diagnostic laparoscopy and while this could be a TOA I would recommend laparoscopy and irrigation and appendectomy regardless the rationale for this approach was discussed with her the options of observation were reviewed and the risk bleeding infection recurrence abscess conversion to an open procedure were all reviewed with her she understood and agreed to proceed [facility] Lawrence, MD, FACS

## 2015-05-25 NOTE — Progress Notes (Signed)
Addendum to the H&P. Patient has personal history of breast cancer on the left which was treated with breast conservation therapy radiation chemotherapy she is at risk for DVT and pulmonary embolus or 4 heparin and SCDs will be utilized.

## 2015-05-25 NOTE — ED Provider Notes (Signed)
[facility] Emergency Department Provider Note  ____________________________________________  Time seen: On arrival  I have reviewed the triage vital signs and the nursing notes.   HISTORY  Chief Complaint Abdominal Pain    HPI Bridget Medina is a 49 y.o. female who presents with complaints of lower abdominal pain for approximately one week. She reports it is cramping in nature. She reports is been getting steadily worse. She does note that she was able to run 8 miles 3 days ago without discomfort but it seems to have worsened since then. She does report a fullness when urinating. She is never had a urinary tract infection before. She denies vaginal discharge. She has had some nausea but no vomiting. No fevers no chills. Normal bowel movements     Past Medical History  Diagnosis Date  . Asthma   . Cancer 2011    breast    There are no active problems to display for this patient.   Past Surgical History  Procedure Laterality Date  . Breast lumpectomy Right     Lumpectomy  . Ivad removal      Current Outpatient Rx  Name  Route  Sig  Dispense  Refill  . albuterol (PROVENTIL HFA;VENTOLIN HFA) 108 (90 BASE) MCG/ACT inhaler   Inhalation   Inhale 2 puffs into the lungs every 4 (four) hours as needed for wheezing or shortness of breath.         Marland Kitchen FLUoxetine (PROZAC) 20 MG/5ML solution   Oral   Take 20 mg by mouth daily.           Allergies Review of patient's allergies indicates no known allergies.  History reviewed. No pertinent family history.  Social History Social History  Substance Use Topics  . Smoking status: Never Smoker   . Smokeless tobacco: None  . Alcohol Use: No    Review of Systems  Constitutional: Negative for fever. Eyes: Negative for visual changes. ENT: Negative for sore throat Cardiovascular: Negative for chest pain. Respiratory: Negative for shortness of breath. Gastrointestinal: Positive for abdominal pain  and nausea Genitourinary: Negative for dysuria. Musculoskeletal: Negative for back pain. Skin: Negative for rash. Neurological: Negative for headaches or focal weakness Psychiatric: No anxiety    ____________________________________________   PHYSICAL EXAM:  VITAL SIGNS: ED Triage Vitals  Enc Vitals Group     BP 05/25/15 0710 97/58 mmHg     Pulse Rate 05/25/15 0710 64     Resp 05/25/15 0710 18     Temp 05/25/15 0710 97.8 F (36.6 C)     Temp Source 05/25/15 0710 Oral     SpO2 05/25/15 0710 100 %     Weight 05/25/15 0710 122 lb (55.339 kg)     Height 05/25/15 0710 5\' 4"  (1.626 m)     Head Cir --      Peak Flow --      Pain Score 05/25/15 0800 10     Pain Loc --      Pain Edu? --      Excl. in [place]? --      Constitutional: Alert and oriented. Well appearing and in no distress. Eyes: Conjunctivae are normal.  ENT   Head: Normocephalic and atraumatic.   Mouth/Throat: Mucous membranes are moist. Cardiovascular: Normal rate, regular rhythm. Normal and symmetric distal pulses are present in all extremities. No murmurs, rubs, or gallops. Respiratory: Normal respiratory effort without tachypnea nor retractions. Breath sounds are clear and equal bilaterally.  Gastrointestinal: Moderate tenderness to palpation  suprapubically and in the lower quadrants bilaterally. No distention. There is no CVA tenderness. Genitourinary: No CMT, very little white discharge Musculoskeletal: Nontender with normal range of motion in all extremities. No lower extremity tenderness nor edema. Neurologic:  Normal speech and language. No gross focal neurologic deficits are appreciated. Skin:  Skin is warm, dry and intact. No rash noted. Psychiatric: Mood and affect are normal. Patient exhibits appropriate insight and judgment.  ____________________________________________    LABS (pertinent positives/negatives)  Labs Reviewed  CBC - Abnormal; Notable for the following:    Hemoglobin 10.4 (*)     HCT 32.8 (*)    MCV 66.6 (*)    MCH 21.1 (*)    MCHC 31.7 (*)    All other components within normal limits  COMPREHENSIVE METABOLIC PANEL  LIPASE, BLOOD  URINALYSIS COMPLETEWITH MICROSCOPIC ([facility] ONLY)    ____________________________________________   EKG  None  ____________________________________________    RADIOLOGY I have personally reviewed any xrays that were ordered on this patient: CT scan of the pelvis shows possible appendicitis versus TOA  ____________________________________________   PROCEDURES  Procedure(s) performed: none  Critical Care performed: none  ____________________________________________   INITIAL IMPRESSION / ASSESSMENT AND PLAN / ED COURSE  Pertinent labs & imaging results that were available during my care of the patient were reviewed by me and considered in my medical decision making (see chart for details).  Initial impression is suspicious for urinary tract infection. We will check labs, urine and reevaluate  Patient's labs and urine are unremarkable. Given her significant tenderness palpation we will order a CT abdomen pelvis.  CT of the pelvis concerning for appendicitis versus TOA. She has no CMT on pelvic exam and only a few white blood cells on wet prep we will also obtain an ultrasound  Ultrasound is not acutely helpful in elucidating the diagnosis.  I have consulted Dr. Burt Knack of Gen. surgery who has seen the patient in the ED and feels this is likely appendicitis and will admit the patient  ____________________________________________   FINAL CLINICAL IMPRESSION(S) / ED DIAGNOSES  Final diagnoses:  Pelvic pain in female  Acute appendicitis with localized peritonitis     Lavonia Drafts, MD 05/25/15 1348

## 2015-05-25 NOTE — ED Notes (Signed)
TO US via stretcher.  AAOx3.  Skin warm and dry.  

## 2015-05-25 NOTE — Anesthesia Procedure Notes (Signed)
Procedure Name: Intubation Date/Time: 05/25/2015 2:44 PM Performed by: Dionne Bucy Pre-anesthesia Checklist: Patient identified, Patient being monitored, Timeout performed, Emergency Drugs available and Suction available Patient Re-evaluated:Patient Re-evaluated prior to inductionOxygen Delivery Method: Circle system utilized Preoxygenation: Pre-oxygenation with 100% oxygen Intubation Type: IV induction and Rapid sequence Laryngoscope Size: Mac and 3 Grade View: Grade I Tube type: Oral Tube size: 7.0 mm Number of attempts: 1 Airway Equipment and Method: Stylet Placement Confirmation: ETT inserted through vocal cords under direct vision,  positive ETCO2 and breath sounds checked- equal and bilateral Secured at: 21 cm Tube secured with: Tape Dental Injury: Teeth and Oropharynx as per pre-operative assessment

## 2015-05-25 NOTE — Anesthesia Preprocedure Evaluation (Addendum)
Anesthesia Evaluation  Patient identified by MRN, date of birth, ID band Patient awake    Reviewed: Allergy & Precautions, NPO status , Patient's Chart, lab work & pertinent test results  Airway Mallampati: I  TM Distance: >3 FB Neck ROM: Full    Dental  (+) Teeth Intact   Pulmonary asthma ,    Pulmonary exam normal        Cardiovascular Exercise Tolerance: Good Normal cardiovascular exam     Neuro/Psych    GI/Hepatic Two week hx of abdominal pain. Nausea, no vx. Presumed appendicitis.    Endo/Other    Renal/GU      Musculoskeletal   Abdominal   Peds  Hematology  (+) anemia , Hb 10.4.   Anesthesia Other Findings Right breast lumpectomy 2012--NO axillary dissection.  Reproductive/Obstetrics                            Anesthesia Physical Anesthesia Plan  ASA: II and emergent  Anesthesia Plan: General   Post-op Pain Management:    Induction: Intravenous and Rapid sequence  Airway Management Planned: Oral ETT  Additional Equipment:   Intra-op Plan:   Post-operative Plan: Extubation in OR  Informed Consent: I have reviewed the patients History and Physical, chart, labs and discussed the procedure including the risks, benefits and alternatives for the proposed anesthesia with the patient or authorized representative who has indicated his/her understanding and acceptance.     Plan Discussed with: CRNA  Anesthesia Plan Comments:         Anesthesia Quick Evaluation

## 2015-05-25 NOTE — ED Notes (Signed)
CT notified pt completed oral contrast.

## 2015-05-25 NOTE — Transfer of Care (Signed)
Immediate Anesthesia Transfer of Care Note  Patient: Bridget Medina  Procedure(s) Performed: Procedure(s): APPENDECTOMY LAPAROSCOPIC (N/A)  Patient Location: PACU  Anesthesia Type:General  Level of Consciousness: sedated  Airway & Oxygen Therapy: Patient Spontanous Breathing and Patient connected to face mask oxygen  Post-op Assessment: Report given to RN and Post -op Vital signs reviewed and stable  Post vital signs: Reviewed and stable  Last Vitals:  Filed Vitals:   05/25/15 1535  BP: 103/62  Pulse: 59  Temp: 37.2 C  Resp: 16    Complications: No apparent anesthesia complications

## 2015-05-25 NOTE — Op Note (Signed)
laparascopic appendectomy   Toniann Ket Date of operation:  05/25/2015  Indications: The patient presented with a history of  abdominal pain. Workup has revealed findings consistent with acute appendicitis.  Pre-operative Diagnosis: Ruptured appendix Post-operative Diagnosis: Ruptured appendix with pelvic side wall abscess   Surgeon: Jerrol Banana. Burt Knack, MD, FACS  Anesthesia: General with endotracheal tube  Procedure Details  The patient was seen again in the preop area. The options of surgery versus observation were reviewed with the patient and/or family. The risks of bleeding, infection, recurrence of symptoms, negative laparoscopy, potential for an open procedure, bowel injury, abscess or infection, were all reviewed as well. The patient was taken to Operating Room, identified as Bridget Medina and the procedure verified as laparoscopic appendectomy. A Time Out was held and the above information confirmed.  The patient was placed in the supine position and general anesthesia was induced.  Antibiotic prophylaxis was administered and VT E prophylaxis was in place. A Foley catheter was placed by the nursing staff.   The abdomen was prepped and draped in a sterile fashion. An infraumbilical incision was made. A Veress needle was placed and pneumoperitoneum was obtained. A 5 mm trocar port was placed without difficulty and the abdominal cavity was explored.  Under direct vision a 5 mm suprapubic port was placed and a 13 mm left lateral port was placed all under direct vision.  The appendix was identified and found to be acutely inflamed. Tracing the appendix from the cecum down to the right pelvic sidewall the serosa was stripped of the appendix and opening of an densely adhesed and acutely inflamed area on the pelvic sidewall resulted in several cc of purulent material exuding from that area the tip of the appendix was elevated and then the base of the appendix   was dissected out and divided  with a standard load Endo GIA. The mesoappendix was divided with a vascular load Endo GIA. The appendix was passed out through the left lateral port site with the aid of an Endo Catch bag. The right lower quadrant and pelvis was then irrigated with copious amounts of normal saline which was aspirated. Inspection  failed to identify any additional bleeding and there were no signs of bowel injury. Therefore the left lateral port site was closed under direct vision utilizing an Endo Close technique with 0 Vicryl interrupted sutures, all under direct vision.   Again the right lower quadrant was inspected there was no sign of bleeding or bowel injury therefore pneumoperitoneum was released, all ports were removed and the skin incisions were approximated with subcuticular 4-0 Monocryl. Steri-Strips and Mastisol and sterile dressings were placed.  The patient tolerated the procedure well, there were no complications. The sponge lap and needle count were correct at the end of the procedure.  The patient was taken to the recovery room in stable condition to be admitted for continued care.  Findings: Acute ruptured appendicitis with pelvic wall abscess  Estimated Blood Loss: Minimal                  Specimens: appendix         Complications:  None                  Loghan Kurtzman E. Burt Knack MD, FACS

## 2015-05-25 NOTE — Progress Notes (Signed)
BP staying in high 80's in [place].  Reported to Dr. Andree Elk.  500 ml bolus ordered and started.

## 2015-05-25 NOTE — ED Notes (Signed)
Patient transported to Ultrasound 

## 2015-05-26 NOTE — Progress Notes (Signed)
1 Day Post-Op  Subjective: Status post laparoscopic appendectomy for ruptured appendix with pelvic sidewall abscess. Patient feels much better does have some minimal incisional pain. She is tolerating a clear liquid diet.  Objective: Vital signs in last 24 hours: Temp:  [97.8 F (36.6 C)-99 F (37.2 C)] 98.4 F (36.9 C) (10/01 0659) Pulse Rate:  [46-58] 50 (10/01 0659) Resp:  [16-24] 16 (10/01 0659) BP: (82-119)/(46-86) 98/50 mmHg (10/01 0659) SpO2:  [98 %-100 %] 100 % (10/01 0659) Last BM Date: 05/25/15  Intake/Output from previous day: 09/30 0701 - 10/01 0700 In: 2873.1 [I.V.:2873.1] Out: 55 [Urine:790; Blood:20] Intake/Output this shift: Total I/O In: 366.7 [I.V.:366.7] Out: 0   Physical exam:  Abdomen is soft and minimally tender wounds are dressed. Calves are nontender.  Lab Results: CBC   Recent Labs  05/25/15 0755 05/25/15 1741  WBC 10.8 12.7*  HGB 10.4* 9.8*  HCT 32.8* 31.1*  PLT 324 296   BMET  Recent Labs  05/25/15 0755 05/25/15 1741  NA 137  --   K 3.9  --   CL 103  --   CO2 28  --   GLUCOSE 92  --   BUN 13  --   CREATININE 0.51 0.54  CALCIUM 8.7*  --    PT/INR No results for input(s): LABPROT, INR in the last 72 hours. ABG No results for input(s): PHART, HCO3 in the last 72 hours.  Invalid input(s): PCO2, PO2  Studies/Results: US Transvaginal Non-ob  05/25/2015   CLINICAL DATA:  Pelvic pain for 1 week.  Nausea and tenderness.  EXAM: TRANSABDOMINAL AND TRANSVAGINAL ULTRASOUND OF PELVIS  TECHNIQUE: Both transabdominal and transvaginal ultrasound examinations of the pelvis were performed. Transabdominal technique was performed for global imaging of the pelvis including uterus, ovaries, adnexal regions, and pelvic cul-de-sac. It was necessary to proceed with endovaginal exam following the transabdominal exam to visualize the endometrial stripe and ovaries.  COMPARISON:  CT on 05/25/2015  FINDINGS: Uterus  Measurements: 10.0 x 6.7 x 6.0 cm.  Retroverted. No fibroids or other mass visualized.  Endometrium  Thickness: 10 mm.  No focal abnormality visualized.  Right ovary  Measurements: No normal ovary visualized. A complex hypoechoic mass is seen in the right adnexa which measures approximately 7.8 x 4.6 x 6.8 cm. This shows areas of internal blood flow on color Doppler ultrasound.  Left ovary  Measurements: 3.0 x 2.0 x 2.8 cm. 2.3 cm follicle noted. Otherwise normal appearance/no adnexal mass.  Other findings  Small amount of free fluid noted.  IMPRESSION: Complex hypoechoic mass in right adnexa measures 7.8 cm. This has nonspecific features, but is suspicious for an inflammatory mass, with differential diagnosis including pelvic inflammatory disease and appendicitis. Neoplasm is considered much less likely based on recent CT appearance.  Normal appearance of uterus and left ovary.   Electronically Signed   By: Earle Gell M.D.   On: 05/25/2015 13:05   US Pelvis Complete  05/25/2015   CLINICAL DATA:  Pelvic pain for 1 week.  Nausea and tenderness.  EXAM: TRANSABDOMINAL AND TRANSVAGINAL ULTRASOUND OF PELVIS  TECHNIQUE: Both transabdominal and transvaginal ultrasound examinations of the pelvis were performed. Transabdominal technique was performed for global imaging of the pelvis including uterus, ovaries, adnexal regions, and pelvic cul-de-sac. It was necessary to proceed with endovaginal exam following the transabdominal exam to visualize the endometrial stripe and ovaries.  COMPARISON:  CT on 05/25/2015  FINDINGS: Uterus  Measurements: 10.0 x 6.7 x 6.0 cm. Retroverted. No fibroids or other  mass visualized.  Endometrium  Thickness: 10 mm.  No focal abnormality visualized.  Right ovary  Measurements: No normal ovary visualized. A complex hypoechoic mass is seen in the right adnexa which measures approximately 7.8 x 4.6 x 6.8 cm. This shows areas of internal blood flow on color Doppler ultrasound.  Left ovary  Measurements: 3.0 x 2.0 x 2.8 cm. 2.3 cm  follicle noted. Otherwise normal appearance/no adnexal mass.  Other findings  Small amount of free fluid noted.  IMPRESSION: Complex hypoechoic mass in right adnexa measures 7.8 cm. This has nonspecific features, but is suspicious for an inflammatory mass, with differential diagnosis including pelvic inflammatory disease and appendicitis. Neoplasm is considered much less likely based on recent CT appearance.  Normal appearance of uterus and left ovary.   Electronically Signed   By: Earle Gell M.D.   On: 05/25/2015 13:05   Ct Abdomen Pelvis W Contrast  05/25/2015   CLINICAL DATA:  Worsening lower abdominal and pelvic pain for 1 week. Lower abdominal tenderness. Nausea.  EXAM: CT ABDOMEN AND PELVIS WITH CONTRAST  TECHNIQUE: Multidetector CT imaging of the abdomen and pelvis was performed using the standard protocol following bolus administration of intravenous contrast.  CONTRAST:  19mL OMNIPAQUE IOHEXOL 300 MG/ML  SOLN  COMPARISON:  None.  FINDINGS: Lower chest:  No acute findings.  Hepatobiliary: No masses or other significant abnormality. Gallbladder is unremarkable.  Pancreas: No mass, inflammatory changes, or other significant abnormality.  Spleen: No evidence of splenomegaly. Tiny less than 1 cm splenic lesions are seen which are too small to characterize but most likely benign.  Adrenals/Urinary Tract: No masses identified. No evidence of hydronephrosis.  Stomach/Bowel: No evidence of bowel obstruction. Wall thickening is seen involving the sigmoid colon, which is likely reactive in etiology due to right pelvic inflammatory process described below.  Vascular/Lymphatic: No pathologically enlarged lymph nodes. No evidence of abdominal aortic aneurysm.  Reproductive: No mass or other significant abnormality.  Other: Inflammatory changes and mild mesenteric lymphadenopathy is seen in right lower quadrant and right adnexa. This obscures normal anatomic landmarks. The appendix is difficult to visualize, but  there is a somewhat linear soft tissue density with a tiny calcification on image 57/series 2 which is suspicious for an appendicolith. There is a small rim enhancing fluid collection in the right adnexa measuring 2.5 cm. Differential diagnosis include ruptured appendicitis, and pelvic inflammatory disease with tubo-ovarian abscess.  Musculoskeletal:  No suspicious bone lesions identified.  IMPRESSION: Right lower quadrant and right adnexal inflammatory process, with 2.5 cm rim enhancing fluid collection. Appendix is not well visualized but there is a possible tiny appendicular left in the area of inflammatory changes. Differential diagnosis includes ruptured appendicitis with small abscess, and pelvic inflammatory disease with small tubo-ovarian abscess.   Electronically Signed   By: Earle Gell M.D.   On: 05/25/2015 11:03    Anti-infectives: Anti-infectives    Start     Dose/Rate Route Frequency Ordered Stop   05/25/15 2100  cefOXitin (MEFOXIN) 1 g in dextrose 5 % 50 mL IVPB     1 g 100 mL/hr over 30 Minutes Intravenous 4 times per day 05/25/15 1534     05/25/15 1545  doxycycline (VIBRA-TABS) tablet 100 mg     100 mg Oral Every 12 hours 05/25/15 1534     05/25/15 1400  cefOXitin (MEFOXIN) 2 g in dextrose 5 % 50 mL IVPB     2 g 100 mL/hr over 30 Minutes Intravenous  Once 05/25/15 1348 05/25/15  1522      Assessment/Plan: s/p Procedure(s): APPENDECTOMY LAPAROSCOPIC   Recommend continuing IV anabiotic's and probably home tomorrow on oral antibiotic's will advance diet today and saline lock.  Florene Glen, MD, FACS  05/26/2015

## 2015-05-27 LAB — CBC WITH DIFFERENTIAL/PLATELET
BASOS PCT: 1 %
Basophils Absolute: 0 10*3/uL (ref 0–0.1)
EOS ABS: 0.1 10*3/uL (ref 0–0.7)
EOS PCT: 1 %
HCT: 28.5 % — ABNORMAL LOW (ref 35.0–47.0)
Hemoglobin: 9 g/dL — ABNORMAL LOW (ref 12.0–16.0)
Lymphocytes Relative: 21 %
Lymphs Abs: 2 10*3/uL (ref 1.0–3.6)
MCH: 21.2 pg — AB (ref 26.0–34.0)
MCHC: 31.7 g/dL — AB (ref 32.0–36.0)
MCV: 67 fL — ABNORMAL LOW (ref 80.0–100.0)
MONO ABS: 1 10*3/uL — AB (ref 0.2–0.9)
MONOS PCT: 11 %
NEUTROS PCT: 66 %
Neutro Abs: 6.3 10*3/uL (ref 1.4–6.5)
PLATELETS: 288 10*3/uL (ref 150–440)
RBC: 4.26 MIL/uL (ref 3.80–5.20)
RDW: 14.5 % (ref 11.5–14.5)
WBC: 9.5 10*3/uL (ref 3.6–11.0)

## 2015-05-27 MED ORDER — CIPROFLOXACIN HCL 500 MG PO TABS: 500.0000 mg | ORAL_TABLET | Freq: Two times a day (BID) | ORAL | Status: DC

## 2015-05-27 MED ORDER — OXYCODONE HCL 5 MG PO TABS: 5.0000 mg | ORAL_TABLET | ORAL | Status: DC | PRN

## 2015-05-27 MED ORDER — METRONIDAZOLE 500 MG PO TABS: 500.0000 mg | ORAL_TABLET | Freq: Three times a day (TID) | ORAL | Status: DC

## 2015-05-27 MED ORDER — DOXYCYCLINE HYCLATE 100 MG PO TABS: 100.0000 mg | ORAL_TABLET | Freq: Two times a day (BID) | ORAL | Status: DC

## 2015-05-27 NOTE — Progress Notes (Signed)
2 Days Post-Op  Subjective: Patient feels better mild nausea no fevers or chills tolerating a diet.  Objective: Vital signs in last 24 hours: Temp:  [98.1 F (36.7 C)-99.2 F (37.3 C)] 98.1 F (36.7 C) (10/02 0419) Pulse Rate:  [48-99] 99 (10/02 0419) Resp:  [17-18] 17 (10/02 0419) BP: (92-118)/(52-76) 118/76 mmHg (10/02 0419) SpO2:  [95 %-100 %] 95 % (10/02 0419) Last BM Date: 05/25/15  Intake/Output from previous day: 10/01 0701 - 10/02 0700 In: 2045 [I.V.:2045] Out: 2425 [Urine:2425] Intake/Output this shift: Total I/O In: 1116.3 [I.V.:1116.3] Out: -   Physical exam:  Soft nontender abdomen wounds are dressed Foley catheter is in place nontender calves  Lab Results: CBC   Recent Labs  05/25/15 1741 05/27/15 0407  WBC 12.7* 9.5  HGB 9.8* 9.0*  HCT 31.1* 28.5*  PLT 296 288   BMET  Recent Labs  05/25/15 0755 05/25/15 1741  NA 137  --   K 3.9  --   CL 103  --   CO2 28  --   GLUCOSE 92  --   BUN 13  --   CREATININE 0.51 0.54  CALCIUM 8.7*  --    PT/INR No results for input(s): LABPROT, INR in the last 72 hours. ABG No results for input(s): PHART, HCO3 in the last 72 hours.  Invalid input(s): PCO2, PO2  Studies/Results: US Transvaginal Non-ob  05/25/2015   CLINICAL DATA:  Pelvic pain for 1 week.  Nausea and tenderness.  EXAM: TRANSABDOMINAL AND TRANSVAGINAL ULTRASOUND OF PELVIS  TECHNIQUE: Both transabdominal and transvaginal ultrasound examinations of the pelvis were performed. Transabdominal technique was performed for global imaging of the pelvis including uterus, ovaries, adnexal regions, and pelvic cul-de-sac. It was necessary to proceed with endovaginal exam following the transabdominal exam to visualize the endometrial stripe and ovaries.  COMPARISON:  CT on 05/25/2015  FINDINGS: Uterus  Measurements: 10.0 x 6.7 x 6.0 cm. Retroverted. No fibroids or other mass visualized.  Endometrium  Thickness: 10 mm.  No focal abnormality visualized.  Right  ovary  Measurements: No normal ovary visualized. A complex hypoechoic mass is seen in the right adnexa which measures approximately 7.8 x 4.6 x 6.8 cm. This shows areas of internal blood flow on color Doppler ultrasound.  Left ovary  Measurements: 3.0 x 2.0 x 2.8 cm. 2.3 cm follicle noted. Otherwise normal appearance/no adnexal mass.  Other findings  Small amount of free fluid noted.  IMPRESSION: Complex hypoechoic mass in right adnexa measures 7.8 cm. This has nonspecific features, but is suspicious for an inflammatory mass, with differential diagnosis including pelvic inflammatory disease and appendicitis. Neoplasm is considered much less likely based on recent CT appearance.  Normal appearance of uterus and left ovary.   Electronically Signed   By: Earle Gell M.D.   On: 05/25/2015 13:05   US Pelvis Complete  05/25/2015   CLINICAL DATA:  Pelvic pain for 1 week.  Nausea and tenderness.  EXAM: TRANSABDOMINAL AND TRANSVAGINAL ULTRASOUND OF PELVIS  TECHNIQUE: Both transabdominal and transvaginal ultrasound examinations of the pelvis were performed. Transabdominal technique was performed for global imaging of the pelvis including uterus, ovaries, adnexal regions, and pelvic cul-de-sac. It was necessary to proceed with endovaginal exam following the transabdominal exam to visualize the endometrial stripe and ovaries.  COMPARISON:  CT on 05/25/2015  FINDINGS: Uterus  Measurements: 10.0 x 6.7 x 6.0 cm. Retroverted. No fibroids or other mass visualized.  Endometrium  Thickness: 10 mm.  No focal abnormality visualized.  Right ovary  Measurements: No normal ovary visualized. A complex hypoechoic mass is seen in the right adnexa which measures approximately 7.8 x 4.6 x 6.8 cm. This shows areas of internal blood flow on color Doppler ultrasound.  Left ovary  Measurements: 3.0 x 2.0 x 2.8 cm. 2.3 cm follicle noted. Otherwise normal appearance/no adnexal mass.  Other findings  Small amount of free fluid noted.  IMPRESSION:  Complex hypoechoic mass in right adnexa measures 7.8 cm. This has nonspecific features, but is suspicious for an inflammatory mass, with differential diagnosis including pelvic inflammatory disease and appendicitis. Neoplasm is considered much less likely based on recent CT appearance.  Normal appearance of uterus and left ovary.   Electronically Signed   By: Earle Gell M.D.   On: 05/25/2015 13:05   Ct Abdomen Pelvis W Contrast  05/25/2015   CLINICAL DATA:  Worsening lower abdominal and pelvic pain for 1 week. Lower abdominal tenderness. Nausea.  EXAM: CT ABDOMEN AND PELVIS WITH CONTRAST  TECHNIQUE: Multidetector CT imaging of the abdomen and pelvis was performed using the standard protocol following bolus administration of intravenous contrast.  CONTRAST:  152mL OMNIPAQUE IOHEXOL 300 MG/ML  SOLN  COMPARISON:  None.  FINDINGS: Lower chest:  No acute findings.  Hepatobiliary: No masses or other significant abnormality. Gallbladder is unremarkable.  Pancreas: No mass, inflammatory changes, or other significant abnormality.  Spleen: No evidence of splenomegaly. Tiny less than 1 cm splenic lesions are seen which are too small to characterize but most likely benign.  Adrenals/Urinary Tract: No masses identified. No evidence of hydronephrosis.  Stomach/Bowel: No evidence of bowel obstruction. Wall thickening is seen involving the sigmoid colon, which is likely reactive in etiology due to right pelvic inflammatory process described below.  Vascular/Lymphatic: No pathologically enlarged lymph nodes. No evidence of abdominal aortic aneurysm.  Reproductive: No mass or other significant abnormality.  Other: Inflammatory changes and mild mesenteric lymphadenopathy is seen in right lower quadrant and right adnexa. This obscures normal anatomic landmarks. The appendix is difficult to visualize, but there is a somewhat linear soft tissue density with a tiny calcification on image 57/series 2 which is suspicious for an  appendicolith. There is a small rim enhancing fluid collection in the right adnexa measuring 2.5 cm. Differential diagnosis include ruptured appendicitis, and pelvic inflammatory disease with tubo-ovarian abscess.  Musculoskeletal:  No suspicious bone lesions identified.  IMPRESSION: Right lower quadrant and right adnexal inflammatory process, with 2.5 cm rim enhancing fluid collection. Appendix is not well visualized but there is a possible tiny appendicular left in the area of inflammatory changes. Differential diagnosis includes ruptured appendicitis with small abscess, and pelvic inflammatory disease with small tubo-ovarian abscess.   Electronically Signed   By: Earle Gell M.D.   On: 05/25/2015 11:03    Anti-infectives: Anti-infectives    Start     Dose/Rate Route Frequency Ordered Stop   05/25/15 2100  cefOXitin (MEFOXIN) 1 g in dextrose 5 % 50 mL IVPB     1 g 100 mL/hr over 30 Minutes Intravenous 4 times per day 05/25/15 1534     05/25/15 1545  doxycycline (VIBRA-TABS) tablet 100 mg     100 mg Oral Every 12 hours 05/25/15 1534     05/25/15 1400  cefOXitin (MEFOXIN) 2 g in dextrose 5 % 50 mL IVPB     2 g 100 mL/hr over 30 Minutes Intravenous  Once 05/25/15 1348 05/25/15 1522      Assessment/Plan: s/p Procedure(s): APPENDECTOMY LAPAROSCOPIC   Recommend discharging patient home  today we'll DC the Foley catheter and send her home on oral antibiotic's and analgesia  to follow-up in 10 days*  Florene Glen, MD, FACS  05/27/2015

## 2015-05-27 NOTE — Discharge Summary (Signed)
Physician Discharge Summary  Patient ID: Bridget Medina MRN: [id number] DOB/AGE: 1966-06-07 49 y.o.  Admit date: 05/25/2015 Discharge date: 05/27/2015   Discharge Diagnoses:  Active Problems:   Appendicitis with peritoneal abscess   Procedures: Lap or scopic appendectomy  Hospital Course: This a patient with signs of ruptured appendix on CT scan and by history she was taken the operating room where laparoscopic appendectomy was performed and drainage of a pelvic sidewall abscess was performed she made an uncomplicated postoperative recovery and is discharged in stable condition on oral analgesia 6 and oral antibiotic's to follow-up in my office in 10 days  Consults: None  Disposition: 01-Home or Self Care     Medication List    TAKE these medications        albuterol 108 (90 BASE) MCG/ACT inhaler  Commonly known as:  PROVENTIL HFA;VENTOLIN HFA  Inhale 2 puffs into the lungs every 4 (four) hours as needed for wheezing or shortness of breath.     doxycycline 100 MG tablet  Commonly known as:  VIBRA-TABS  Take 1 tablet (100 mg total) by mouth every 12 (twelve) hours.     FLUoxetine 20 MG/5ML solution  Commonly known as:  PROZAC  Take 20 mg by mouth daily.     oxyCODONE 5 MG immediate release tablet  Commonly known as:  Oxy IR/ROXICODONE  Take 1 tablet (5 mg total) by mouth every 4 (four) hours as needed for moderate pain.         Florene Glen, MD, FACS

## 2015-05-27 NOTE — Progress Notes (Signed)
Alert and oriented. Vss. No signs of acute distress. Discharge instructions  given. Patient verbalized understanding. 

## 2015-05-27 NOTE — Discharge Instructions (Signed)
Remove dressing in 24 hours. °May shower in 24 hours. °Leave paper strips in place. °Resume all home medications. °Follow-up with Dr. Mamoudou Mulvehill in 10 days. °

## 2015-05-28 NOTE — Anesthesia Postprocedure Evaluation (Signed)
  Anesthesia Post-op Note  Patient: Bridget Medina  Procedure(s) Performed: Procedure(s): APPENDECTOMY LAPAROSCOPIC (N/A)  Anesthesia type:General  Patient location: PACU  Post pain: Pain level controlled  Post assessment: Post-op Vital signs reviewed, Patient's Cardiovascular Status Stable, Respiratory Function Stable, Patent Airway and No signs of Nausea or vomiting  Post vital signs: Reviewed and stable  Last Vitals:  Filed Vitals:   05/27/15 0419  BP: 118/76  Pulse: 99  Temp: 36.7 C  Resp: 17    Level of consciousness: awake, alert  and patient cooperative  Complications: No apparent anesthesia complications

## 2015-05-29 ENCOUNTER — Telehealth: Payer: Self-pay

## 2015-05-29 LAB — SURGICAL PATHOLOGY

## 2015-05-29 NOTE — Telephone Encounter (Signed)
Patient was called at this time to make post-op appointment for 10 days per Dr. Antionette Char request at Discharge.  Patient states that she is having some nausea, denies vomiting, fever, and abdominal pain. She continues to take her Antibiotics but has been taking them on an empty stomach. Encouraged patient to take them with food and this should help with this. Patient also having some bloating in her abdomen. Has yet to have a good bowel movement. Encouraged her to increase amount of water, walk as much as possible, and use Miralax if needed to help with this. Also explained about CO2 possibly causing some of this bloating and this should subside.   She was very thankful for me calling to check on her and will call with any further questions or concerns.

## 2015-06-06 ENCOUNTER — Encounter: Payer: Self-pay | Admitting: Surgery

## 2015-06-06 ENCOUNTER — Ambulatory Visit (INDEPENDENT_AMBULATORY_CARE_PROVIDER_SITE_OTHER): Admitting: Surgery

## 2015-06-06 VITALS — BP 111/66 | HR 50 | Temp 97.6°F | Ht 64.0 in | Wt 127.6 lb

## 2015-06-06 DIAGNOSIS — Z09 Encounter for follow-up examination after completed treatment for conditions other than malignant neoplasm: Secondary | ICD-10-CM

## 2015-06-06 NOTE — Patient Instructions (Signed)
Please take Ibuprofen 3 times daily around the clock for 7 days. You must take this with food as this will irritate your stomach if you don't take it with food.  Resume taking your fluoxetine today.  If you pain is becoming worse, you develop abdominal pain, nausea or vomiting, or fever please call our office immediately.  Avoid lifting greater than 15 lbs. and strenuous running until you are seen next week by Dr. Burt Knack.

## 2015-06-06 NOTE — Progress Notes (Signed)
Surgery Progress Note  S:  Some nausea/headache.  C/o left hip pain radiating posterior leg.  Some loose stools O:Blood pressure 111/66, pulse 50, temperature 97.6 F (36.4 C), height 5\' 4"  (1.626 m), weight 127 lb 9.6 oz (57.879 kg). GEN: NAD/A&Ox3 ABD: soft, nontender, nondistended Left Leg: tender to palp  A/P 49 yo s/p lap appy, doing well except for left hip pain, likely musculoskeletal - ibuprofen - f/u in 1 week to ensure resolution

## 2015-06-14 ENCOUNTER — Ambulatory Visit (INDEPENDENT_AMBULATORY_CARE_PROVIDER_SITE_OTHER): Admitting: Surgery

## 2015-06-14 ENCOUNTER — Encounter: Payer: Self-pay | Admitting: Surgery

## 2015-06-14 VITALS — BP 113/65 | HR 51 | Temp 98.2°F | Ht 64.0 in | Wt 129.0 lb

## 2015-06-14 DIAGNOSIS — K3533 Acute appendicitis with perforation and localized peritonitis, with abscess: Secondary | ICD-10-CM

## 2015-06-14 DIAGNOSIS — K353 Acute appendicitis with localized peritonitis: Secondary | ICD-10-CM

## 2015-06-14 NOTE — Progress Notes (Signed)
Outpatient postop visit  06/14/2015  Bridget Medina is an 49 y.o. female.    Procedure: Laparoscopic appendectomy  CC: No complaints  HPI: Patient feels well status post laparoscopic appendectomy for ruptured appendix with periappendiceal pelvic sidewall abscess. She has completed her antibiotics and has had no fevers or chills no nausea or vomiting she is eating well and has started running again and doing jump rope.  Medications reviewed.    Physical Exam:  BP 113/65 mmHg  Pulse 51  Temp(Src) 98.2 F (36.8 C) (Oral)  Ht 5\' 4"  (1.626 m)  Wt 129 lb (58.514 kg)  BMI 22.13 kg/m2    PE: Soft nontender abdomen nondistended wounds clean no erythema no drainage    Assessment/Plan:  Pathology is personally reviewed and discussed with the patient. She's doing very well recommend follow up on an as-needed basis I asked her not to do jump rope running is okay for now and no heavy lifting for another 2 weeks. Also discussed with her the symptoms of a recurrent abscess and to return should she have any of those  Florene Glen, MD, FACS

## 2015-11-20 ENCOUNTER — Emergency Department
Admission: EM | Admit: 2015-11-20 | Discharge: 2015-11-20 | Disposition: A | Discharge status: Home / Self Care | Attending: Emergency Medicine | Admitting: Emergency Medicine

## 2015-11-20 ENCOUNTER — Encounter: Payer: Self-pay | Admitting: Emergency Medicine

## 2015-11-20 DIAGNOSIS — J45909 Unspecified asthma, uncomplicated: Secondary | ICD-10-CM | POA: Insufficient documentation

## 2015-11-20 DIAGNOSIS — F331 Major depressive disorder, recurrent, moderate: Secondary | ICD-10-CM

## 2015-11-20 DIAGNOSIS — Z853 Personal history of malignant neoplasm of breast: Secondary | ICD-10-CM | POA: Diagnosis not present

## 2015-11-20 DIAGNOSIS — F329 Major depressive disorder, single episode, unspecified: Secondary | ICD-10-CM | POA: Insufficient documentation

## 2015-11-20 DIAGNOSIS — Z79899 Other long term (current) drug therapy: Secondary | ICD-10-CM | POA: Insufficient documentation

## 2015-11-20 DIAGNOSIS — F32A Depression, unspecified: Secondary | ICD-10-CM

## 2015-11-20 LAB — COMPREHENSIVE METABOLIC PANEL
ALBUMIN: 4.6 g/dL (ref 3.5–5.0)
ALK PHOS: 36 U/L — AB (ref 38–126)
ALT: 13 U/L — AB (ref 14–54)
ANION GAP: 7 (ref 5–15)
AST: 18 U/L (ref 15–41)
BILIRUBIN TOTAL: 1.2 mg/dL (ref 0.3–1.2)
BUN: 16 mg/dL (ref 6–20)
CALCIUM: 9.5 mg/dL (ref 8.9–10.3)
CO2: 25 mmol/L (ref 22–32)
CREATININE: 0.61 mg/dL (ref 0.44–1.00)
Chloride: 106 mmol/L (ref 101–111)
GFR calc Af Amer: >60 mL/min (ref >60)
GFR calc non Af Amer: >60 mL/min (ref >60)
GLUCOSE: 95 mg/dL (ref 65–99)
Potassium: 3.6 mmol/L (ref 3.5–5.1)
Sodium: 138 mmol/L (ref 135–145)
TOTAL PROTEIN: 7.5 g/dL (ref 6.5–8.1)

## 2015-11-20 LAB — URINALYSIS COMPLETE WITH MICROSCOPIC (ARMC ONLY)
BACTERIA UA: NONE SEEN
Bilirubin Urine: NEGATIVE
GLUCOSE, UA: NEGATIVE mg/dL
HGB URINE DIPSTICK: NEGATIVE
Ketones, ur: NEGATIVE mg/dL
Leukocytes, UA: NEGATIVE
Nitrite: NEGATIVE
PH: 7 (ref 5.0–8.0)
PROTEIN: 30 mg/dL — AB
Specific Gravity, Urine: 1.024 (ref 1.005–1.030)

## 2015-11-20 LAB — CBC WITH DIFFERENTIAL/PLATELET
BASOS ABS: 0.1 10*3/uL (ref 0–0.1)
Basophils Relative: 1 %
Eosinophils Absolute: 0.1 10*3/uL (ref 0–0.7)
Eosinophils Relative: 2 %
HEMATOCRIT: 36.4 % (ref 35.0–47.0)
HEMOGLOBIN: 11.6 g/dL — AB (ref 12.0–16.0)
LYMPHS PCT: 49 %
Lymphs Abs: 1.9 10*3/uL (ref 1.0–3.6)
MCH: 21.1 pg — ABNORMAL LOW (ref 26.0–34.0)
MCHC: 32 g/dL (ref 32.0–36.0)
MCV: 65.8 fL — AB (ref 80.0–100.0)
MONO ABS: 0.3 10*3/uL (ref 0.2–0.9)
MONOS PCT: 8 %
NEUTROS ABS: 1.6 10*3/uL (ref 1.4–6.5)
NEUTROS PCT: 40 %
Platelets: 283 10*3/uL (ref 150–440)
RBC: 5.53 MIL/uL — ABNORMAL HIGH (ref 3.80–5.20)
RDW: 14.6 % — AB (ref 11.5–14.5)
WBC: 4 10*3/uL (ref 3.6–11.0)

## 2015-11-20 LAB — URINE DRUG SCREEN, QUALITATIVE (ARMC ONLY)
Amphetamines, Ur Screen: NOT DETECTED
BARBITURATES, UR SCREEN: NOT DETECTED
BENZODIAZEPINE, UR SCRN: NOT DETECTED
COCAINE METABOLITE, UR ~~LOC~~: NOT DETECTED
Cannabinoid 50 Ng, Ur ~~LOC~~: NOT DETECTED
MDMA (Ecstasy)Ur Screen: NOT DETECTED
METHADONE SCREEN, URINE: NOT DETECTED
Opiate, Ur Screen: NOT DETECTED
Phencyclidine (PCP) Ur S: NOT DETECTED
TRICYCLIC, UR SCREEN: NOT DETECTED

## 2015-11-20 LAB — TSH: TSH: 1.176 u[IU]/mL (ref 0.350–4.500)

## 2015-11-20 LAB — POCT PREGNANCY, URINE: Preg Test, Ur: NEGATIVE

## 2015-11-20 LAB — ETHANOL

## 2015-11-20 MED ORDER — MIRTAZAPINE 15 MG PO TBDP: 15.0000 mg | ORAL_TABLET | Freq: Every day | ORAL | Status: DC

## 2015-11-20 NOTE — BH Assessment (Addendum)
Tele Assessment Note   Bridget Medina is an 50 y.o. female. She presents voluntarily to [facility] with depression. She says she has been depressed for 2 yrs. Pt says she is prescribed Prozac at [place] but med isn't helping. Pt is cooperative and oriented x 4. She says, "I'm just not feeling good mentally." She describes mood as "drained and weak and depressed." Pt answers most questions with "yes" or "no" and doesn't elaborate often. Pt denies SI and HI. She denies [facility] and no delusions noted. Pt denies hx of suicide attempts and denies hx of self harm. She reports she works full time at [employer] and served in [employer] for 24 yrs. Pt lives with her two children (ages 3 & 43). She endorses insomnia, poor appetite (lost 8 lbs in 2 weeks), isolating, loss of interest in usual pleasures, and tearfulness. She reports moderate anxiety with one panic attack July 2016. Pt denies hx of abuse and denies hx of substance abuse. She says she has been fasting for Malena Edman and says her fasting means not eating meat. Pt sts she has no appetite. She reports family history of substance abuse. She denies access to firearms.   Diagnosis:  Major Depressive Disorder, Recurrent, Severe without Psychotic Features  Past Medical History:  Past Medical History  Diagnosis Date  . Asthma   . 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 Breast lumpectomy Right     Lumpectomy  . Ivad removal    . Laparoscopic appendectomy N/A 05/25/2015    Procedure: APPENDECTOMY LAPAROSCOPIC;  Surgeon: Florene Glen, MD;  Location: [facility] ORS;  Service: General;  Laterality: N/A;    Family History:  Family History  Problem Relation Age of Onset  . Hypertension Mother     Social History:  reports that she has never smoked. She has never used smokeless tobacco. She reports that she drinks alcohol. She reports that she does not use illicit drugs.  Additional Social History:  Alcohol / Drug Use Pain  Medications: pt denies abuse - see PTA meds list Prescriptions: pt denies abuse - see PTA meds list Over the Counter: pt denies abuse - see PTA meds list History of alcohol / drug use?: Yes Substance #1 Name of Substance 1: alcohol 1 - Frequency: once a month 1 - Last Use / Amount: unknown  CIWA: CIWA-Ar BP: (!) 149/94 mmHg Pulse Rate: (!) 58 COWS:    PATIENT STRENGTHS: (choose at least two) Ability for insight Average or above average intelligence Capable of independent living Occupational psychologist fund of knowledge Work skills  Allergies: No Known Allergies  Home Medications:  (Not in a hospital admission)  OB/GYN Status:  No LMP recorded. Patient is not currently having periods (Reason: Perimenopausal).  General Assessment Data Location of Assessment: [facility] ED TTS Assessment: In system Is this a Tele or Face-to-Face Assessment?: Tele Assessment Is this an Initial Assessment or a Re-assessment for this encounter?: Initial Assessment Marital status: Widowed Elwin Sleight name: Tollie Pizza Is patient pregnant?: No Pregnancy Status: No Living Arrangements: Children (69 & 24 yo kids) Can pt return to current living arrangement?: Yes Admission Status: Voluntary Is patient capable of signing voluntary admission?: Yes Referral Source: Self/Family/Friend Insurance type: tricare     Crisis Care Plan Living Arrangements: Children (68 & 61 yo kids) Name of Psychiatrist: none Name of Therapist: saw [place] therapist for first time 3 weeks ago  Education Status Is  patient currently in school?: No Highest grade of school patient has completed: 60 Name of school: [employer]  Risk to self with the past 6 months Suicidal Ideation: No Has patient been a risk to self within the past 6 months prior to admission? : No Suicidal Intent: No Has patient had any suicidal intent within the past 6 months prior to admission? : No Is patient at risk for suicide?: No Suicidal  Plan?: No Has patient had any suicidal plan within the past 6 months prior to admission? : No Access to Means: No What has been your use of drugs/alcohol within the last 12 months?: alcohol once a month Previous Attempts/Gestures: No How many times?: 0 Other Self Harm Risks: none Triggers for Past Attempts:  (n/a) Intentional Self Injurious Behavior: None Family Suicide History: No Recent stressful life event(s): Other (Comment) (depressive symptoms) Persecutory voices/beliefs?: No Depression: Yes Depression Symptoms: Insomnia, Isolating, Loss of interest in usual pleasures, Tearfulness, Fatigue (poor appetite -) Substance abuse history and/or treatment for substance abuse?: No Suicide prevention information given to non-admitted patients: Not applicable  Risk to Others within the past 6 months Homicidal Ideation: No Does patient have any lifetime risk of violence toward others beyond the six months prior to admission? : No Thoughts of Harm to Others: No Current Homicidal Intent: No Current Homicidal Plan: No Access to Homicidal Means: No Identified Victim: none History of harm to others?: No Assessment of Violence: None Noted Violent Behavior Description: pt denies hx violence Does patient have access to weapons?: No Criminal Charges Pending?: No Does patient have a court date: No Is patient on probation?: No  Psychosis Hallucinations: None noted Delusions: None noted  Mental Status Report Appearance/Hygiene: Unremarkable Eye Contact: Good Motor Activity: Freedom of movement Speech: Logical/coherent Level of Consciousness: Alert, Quiet/awake Mood: Anxious, Depressed, Anhedonia, Sad Affect: Appropriate to circumstance, Depressed, Sad Anxiety Level: Panic Attacks Panic attack frequency: once Most recent panic attack: July 2016 Thought Processes: Relevant, Coherent Judgement: Unimpaired Orientation: Person, Place, Time, Situation Obsessive Compulsive  Thoughts/Behaviors: None  Cognitive Functioning Concentration: Normal Memory: Recent Intact, Remote Intact IQ: Average Insight: Fair Impulse Control: Good Appetite: Poor Weight Loss: 8 (in 2 weeks) Sleep: Decreased Total Hours of Sleep: 3 Vegetative Symptoms: None  ADLScreening [facility] Assessment Services) Patient's cognitive ability adequate to safely complete daily activities?: Yes Patient able to express need for assistance with ADLs?: Yes Independently performs ADLs?: Yes (appropriate for developmental age)  Prior Inpatient Therapy Prior Inpatient Therapy: No Prior Therapy Dates: na Prior Therapy Facilty/Provider(s): na Reason for Treatment: na  Prior Outpatient Therapy Prior Outpatient Therapy: Yes Prior Therapy Dates: currently [facility] Reason for Treatment: depression Does patient have an ACCT team?: No Does patient have Intensive In-House Services?  : No Does patient have [facility] services? : Unknown Does patient have P4[facility] services?: Unknown  ADL Screening (condition at time of admission) Patient's cognitive ability adequate to safely complete daily activities?: Yes Is the patient deaf or have difficulty hearing?: No Does the patient have difficulty seeing, even when wearing glasses/contacts?: No Does the patient have difficulty concentrating, remembering, or making decisions?: No Patient able to express need for assistance with ADLs?: Yes Does the patient have difficulty dressing or bathing?: No Independently performs ADLs?: Yes (appropriate for developmental age) Does the patient have difficulty walking or climbing stairs?: No Weakness of Legs: None Weakness of Arms/Hands: None  Home Assistive Devices/Equipment Home Assistive Devices/Equipment: None    Abuse/Neglect Assessment (Assessment to be complete while  patient is alone) Physical Abuse: Denies Verbal Abuse: Denies Sexual Abuse: Denies Exploitation of patient/patient's  resources: Denies Self-Neglect: Denies     Regulatory affairs officer (For Healthcare) Does patient have an advance directive?: No Would patient like information on creating an advanced directive?: No - patient declined information    Additional Information 1:1 In Past 12 Months?: No CIRT Risk: No Elopement Risk: No Does patient have medical clearance?: Yes     Disposition:  Disposition Initial Assessment Completed for this Encounter: Yes Disposition of Patient:  (pending psychiatric evaluation)  Danylle Ouk P 11/20/2015 9:18 AM

## 2015-11-20 NOTE — Consult Note (Signed)
[facility] Face-to-Face Psychiatry Consult   Reason for Consult:  Depression Referring Physician:  Lenise Arena M.D. Patient Identification: Bridget Medina MRN:  [id number] Principal Diagnosis: Major depressive disorder recurrent moderate Diagnosis:   Patient Active Problem List   Diagnosis Date Noted  . Appendicitis with peritoneal abscess [K35.3] 05/25/2015  . Acute appendicitis with localized peritonitis [K35.3]   . Pelvic pain in female [R10.2]     Total Time spent with patient: 1 hour  Subjective:   Bridget Medina is a 50 y.o. female patient admitted with depressive symptoms  HPI:   Most of the history was obtained from the patient as well as review of her chart. Patient is a 50 year old female who currently lives in [place],  [place] presented for worsening of her depressive symptoms. She reported that she has been depressed for a lot of things. She reported that her husband passed away in Nov 03, 2012 due to melanoma. She was married only for a month. She has also been diagnosed with breast cancer in November 04, 2011 and had a lumpectomy done. Patient reported that she follows up at [facility] in [place] and is currently in remission. Patient reported that she recently broke up with her boyfriend 2 weeks ago as she thought that he was cheating on her. Patient reported that she feels depressed hopeless helpless. She has been taking Prozac 2 teaspoon equivalent to 20 mg on a daily basis. She reported that she cannot swallow pills. Patient reported that she has been taking Prozac off and on for almost 15 years as prescribed by the [facility] hospital. Patient reported that she has never tried any other psychotropic medication for her depression Patient reported that she sleeps poorly at night. She currently denied having any suicidal homicidal ideations or plans She denied having any perceptual disturbances. Patient is open to suggestions and having her medications adjusted. She reported that she drinks wine  once a month. She denied using other illicit drugs.  Past Psychiatric History:  Patient has long history of depression and has been tried on Prozac by the [facility]. She denied any history of suicide attempts in the past. She has never been admitted to a psychiatric hospital.  Risk to Self: Suicidal Ideation: No Suicidal Intent: No Is patient at risk for suicide?: No Suicidal Plan?: No Access to Means: No What has been your use of drugs/alcohol within the last 12 months?: alcohol once a month How many times?: 0 Other Self Harm Risks: none Triggers for Past Attempts:  (n/a) Intentional Self Injurious Behavior: None Risk to Others: Homicidal Ideation: No Thoughts of Harm to Others: No Current Homicidal Intent: No Current Homicidal Plan: No Access to Homicidal Means: No Identified Victim: none History of harm to others?: No Assessment of Violence: None Noted Violent Behavior Description: pt denies hx violence Does patient have access to weapons?: No Criminal Charges Pending?: No Does patient have a court date: No Prior Inpatient Therapy: Prior Inpatient Therapy: No Prior Therapy Dates: na Prior Therapy Facilty/Provider(s): na Reason for Treatment: na Prior Outpatient Therapy: Prior Outpatient Therapy: Yes Prior Therapy Dates: currently [facility] Reason for Treatment: depression Does patient have an ACCT team?: No Does patient have Intensive In-House Services?  : No Does patient have [facility] services? : Unknown Does patient have P4CC services?: Unknown  Past Medical History:  Past Medical History  Diagnosis Date  . Asthma   . Cancer [facility]) Nov 03, 2009    breast    Past Surgical History  Procedure Laterality Date  . Breast  lumpectomy Right     Lumpectomy  . Ivad removal    . Laparoscopic appendectomy N/A 05/25/2015    Procedure: APPENDECTOMY LAPAROSCOPIC;  Surgeon: Florene Glen, MD;  Location: [facility] ORS;  Service: General;  Laterality: N/A;    Family History:  Family History  Problem Relation Age of Onset  . Hypertension Mother    Family Psychiatric  History: She denied any history of psychiatric illness in her family. Social History:  History  Alcohol Use  . Yes    Comment: Occasional     History  Drug Use No    Social History   Social History  . Marital Status: Widowed    Spouse Name: N/A  . Number of Children: N/A  . Years of Education: N/A   Social History Main Topics  . Smoking status: Never Smoker   . Smokeless tobacco: Never Used  . Alcohol Use: Yes     Comment: Occasional  . Drug Use: No  . Sexual Activity: Not Asked   Other Topics Concern  . None   Social History Narrative   Additional Social History:  patient currently lives with her children and works full-time in a bank.  Allergies:  No Known Allergies  Labs:  Results for orders placed or performed during the hospital encounter of 11/20/15 (from the past 48 hour(s))  CBC with Differential/Platelet     Status: Abnormal   Collection Time: 11/20/15  7:52 AM  Result Value Ref Range   WBC 4.0 3.6 - 11.0 K/uL   RBC 5.53 (H) 3.80 - 5.20 MIL/uL   Hemoglobin 11.6 (L) 12.0 - 16.0 g/dL   HCT 36.4 35.0 - 47.0 %   MCV 65.8 (L) 80.0 - 100.0 fL   MCH 21.1 (L) 26.0 - 34.0 pg   MCHC 32.0 32.0 - 36.0 g/dL   RDW 14.6 (H) 11.5 - 14.5 %   Platelets 283 150 - 440 K/uL   Neutrophils Relative % 40 %   Neutro Abs 1.6 1.4 - 6.5 K/uL   Lymphocytes Relative 49 %   Lymphs Abs 1.9 1.0 - 3.6 K/uL   Monocytes Relative 8 %   Monocytes Absolute 0.3 0.2 - 0.9 K/uL   Eosinophils Relative 2 %   Eosinophils Absolute 0.1 0 - 0.7 K/uL   Basophils Relative 1 %   Basophils Absolute 0.1 0 - 0.1 K/uL  TSH     Status: None   Collection Time: 11/20/15  7:52 AM  Result Value Ref Range   TSH 1.176 0.350 - 4.500 uIU/mL  Comprehensive metabolic panel     Status: Abnormal   Collection Time: 11/20/15  7:52 AM  Result Value Ref Range   Sodium 138 135 - 145 mmol/L    Potassium 3.6 3.5 - 5.1 mmol/L   Chloride 106 101 - 111 mmol/L   CO2 25 22 - 32 mmol/L   Glucose, Bld 95 65 - 99 mg/dL   BUN 16 6 - 20 mg/dL   Creatinine, Ser 0.61 0.44 - 1.00 mg/dL   Calcium 9.5 8.9 - 10.3 mg/dL   Total Protein 7.5 6.5 - 8.1 g/dL   Albumin 4.6 3.5 - 5.0 g/dL   AST 18 15 - 41 U/L   ALT 13 (L) 14 - 54 U/L   Alkaline Phosphatase 36 (L) 38 - 126 U/L   Total Bilirubin 1.2 0.3 - 1.2 mg/dL   GFR calc non Af Amer >60 >60 mL/min   GFR calc Af Amer >60 >60 mL/min  Comment: (NOTE) The eGFR has been calculated using the CKD EPI equation. This calculation has not been validated in all clinical situations. eGFR's persistently <60 mL/min signify possible Chronic Kidney Disease.    Anion gap 7 5 - 15  Urinalysis complete, with microscopic ([facility] only)     Status: Abnormal   Collection Time: 11/20/15  7:52 AM  Result Value Ref Range   Color, Urine YELLOW (A) YELLOW   APPearance CLEAR (A) CLEAR   Glucose, UA NEGATIVE NEGATIVE mg/dL   Bilirubin Urine NEGATIVE NEGATIVE   Ketones, ur NEGATIVE NEGATIVE mg/dL   Specific Gravity, Urine 1.024 1.005 - 1.030   Hgb urine dipstick NEGATIVE NEGATIVE   pH 7.0 5.0 - 8.0   Protein, ur 30 (A) NEGATIVE mg/dL   Nitrite NEGATIVE NEGATIVE   Leukocytes, UA NEGATIVE NEGATIVE   RBC / HPF 0-5 0 - 5 RBC/hpf   WBC, UA 0-5 0 - 5 WBC/hpf   Bacteria, UA NONE SEEN NONE SEEN   Squamous Epithelial / LPF 0-5 (A) NONE SEEN   Mucous PRESENT    Hyaline Casts, UA PRESENT   Urine Drug Screen, Qualitative ([facility] only)     Status: None   Collection Time: 11/20/15  7:52 AM  Result Value Ref Range   Tricyclic, Ur Screen NONE DETECTED NONE DETECTED   Amphetamines, Ur Screen NONE DETECTED NONE DETECTED   MDMA (Ecstasy)Ur Screen NONE DETECTED NONE DETECTED   Cocaine Metabolite,Ur [place] NONE DETECTED NONE DETECTED   Opiate, Ur Screen NONE DETECTED NONE DETECTED   Phencyclidine (PCP) Ur S NONE DETECTED NONE DETECTED   Cannabinoid 50 Ng, Ur [place] NONE DETECTED NONE  DETECTED   Barbiturates, Ur Screen NONE DETECTED NONE DETECTED   Benzodiazepine, Ur Scrn NONE DETECTED NONE DETECTED   Methadone Scn, Ur NONE DETECTED NONE DETECTED    Comment: (NOTE) 354  Tricyclics, urine               Cutoff 1000 ng/mL 200  Amphetamines, urine             Cutoff 1000 ng/mL 300  MDMA (Ecstasy), urine           Cutoff 500 ng/mL 400  Cocaine Metabolite, urine       Cutoff 300 ng/mL 500  Opiate, urine                   Cutoff 300 ng/mL 600  Phencyclidine (PCP), urine      Cutoff 25 ng/mL 700  Cannabinoid, urine              Cutoff 50 ng/mL 800  Barbiturates, urine             Cutoff 200 ng/mL 900  Benzodiazepine, urine           Cutoff 200 ng/mL 1000 Methadone, urine                Cutoff 300 ng/mL 1100 1200 The urine drug screen provides only a preliminary, unconfirmed 1300 analytical test result and should not be used for non-medical 1400 purposes. Clinical consideration and professional judgment should 1500 be applied to any positive drug screen result due to possible 1600 interfering substances. A more specific alternate chemical method 1700 must be used in order to obtain a confirmed analytical result.  1800 Gas chromato graphy / mass spectrometry (GC/MS) is the preferred 1900 confirmatory method.   Ethanol     Status: None   Collection Time: 11/20/15  7:52 AM  Result Value  Ref Range   Alcohol, Ethyl (B) <5 <5 mg/dL    Comment:        LOWEST DETECTABLE LIMIT FOR SERUM ALCOHOL IS 5 mg/dL FOR MEDICAL PURPOSES ONLY   Pregnancy, urine POC     Status: None   Collection Time: 11/20/15  9:41 AM  Result Value Ref Range   Preg Test, Ur NEGATIVE NEGATIVE    Comment:        THE SENSITIVITY OF THIS METHODOLOGY IS >24 mIU/mL     No current facility-administered medications for this encounter.   Current Outpatient Prescriptions  Medication Sig Dispense Refill  . albuterol (PROVENTIL HFA;VENTOLIN HFA) 108 (90 BASE) MCG/ACT inhaler Inhale 2 puffs into the lungs  every 4 (four) hours as needed for wheezing or shortness of breath.    . ciprofloxacin (CIPRO) 500 MG tablet Take 1 tablet (500 mg total) by mouth 2 (two) times daily. (Patient not taking: Reported on 06/14/2015) 20 tablet 1  . doxycycline (VIBRA-TABS) 100 MG tablet Take 1 tablet (100 mg total) by mouth every 12 (twelve) hours. (Patient not taking: Reported on 06/14/2015) 20 tablet 1  . FLUoxetine (PROZAC) 20 MG/5ML solution Take 20 mg by mouth daily.    . Melatonin 5 MG TABS Take 1 tablet by mouth at bedtime.    . metroNIDAZOLE (FLAGYL) 500 MG tablet Take 1 tablet (500 mg total) by mouth 3 (three) times daily. (Patient not taking: Reported on 06/14/2015) 30 tablet 1  . mirtazapine (REMERON SOLTAB) 15 MG disintegrating tablet Take 1 tablet (15 mg total) by mouth at bedtime. 30 tablet 1    Musculoskeletal: Strength & Muscle Tone: within normal limits Gait & Station: normal Patient leans: N/A  Psychiatric Specialty Exam: Review of Systems  Constitutional: Positive for malaise/fatigue.  Psychiatric/Behavioral: Positive for depression. The patient is nervous/anxious.     Blood pressure 149/94, pulse 58, temperature 98.1 F (36.7 C), temperature source Oral, resp. rate 18, height _0  (1.626 m), weight 119 lb (53.978 kg), SpO2 100 %.Body mass index is 20.42 kg/(m^2).  General Appearance: Casual  Eye Contact::  Fair  Speech:  Slow  Volume:  Decreased  Mood:  Anxious and Depressed  Affect:  Appropriate  Thought Process:  Coherent  Orientation:  Full (Time, Place, and Person)  Thought Content:  WDL  Suicidal Thoughts:  No  Homicidal Thoughts:  No  Memory:  Immediate;   Fair  Judgement:  Fair  Insight:  Fair  Psychomotor Activity:  Normal  Concentration:  Fair  Recall:  [employer] of Knowledge:Fair  Language: Fair  Akathisia:  No  Handed:  Right  AIMS (if indicated):     Assets:  Communication Skills Desire for Improvement Financial Resources/Insurance Social  Support Transportation  ADL's:  Intact  Cognition: WNL  Sleep:      Treatment Plan Summary: Medication management  Disposition: No evidence of imminent risk to self or others at present.   Patient does not meet criteria for psychiatric inpatient admission. Discussed crisis plan, support from social network, calling 911, coming to the Emergency Department, and calling Suicide Hotline.   Patient does not meet the criteria for inpatient psychiatric hospitalization.  She will be started on mirtazapine SolTab 15 mg at bedtime She agreed with the plan I will discontinue Prozac at this time Patient will follow up at [place]  Thank you for allowing me to participate  in the care of this patient    This note was generated in part or  whole with voice recognition software. Voice regonition is usually quite accurate but there are transcription errors that can and very often do occur. I apologize for any typographical errors that were not detected and corrected.     Rainey Pines, MD 11/20/2015 11:10 AM

## 2015-11-20 NOTE — ED Notes (Signed)
BEHAVIORAL HEALTH ROUNDING Patient sleeping: No. Patient alert and oriented: yes Behavior appropriate: Yes.  ; If no, describe:  Nutrition and fluids offered: yes Toileting and hygiene offered: Yes  Sitter present: q15 minute observations and security monitoring Law enforcement present: Yes  ODS  Breakfast provided

## 2015-11-20 NOTE — Discharge Instructions (Signed)
Major Depressive Disorder Major depressive disorder is a mental illness. It also may be called clinical depression or unipolar depression. Major depressive disorder usually causes feelings of sadness, hopelessness, or helplessness. Some people with this disorder do not feel particularly sad but lose interest in doing things they used to enjoy (anhedonia). Major depressive disorder also can cause physical symptoms. It can interfere with work, school, relationships, and other normal everyday activities. The disorder varies in severity but is longer lasting and more serious than the sadness we all feel from time to time in our lives. Major depressive disorder often is triggered by stressful life events or major life changes. Examples of these triggers include divorce, loss of your job or home, a move, and the death of a family member or close friend. Sometimes this disorder occurs for no obvious reason at all. People who have family members with major depressive disorder or bipolar disorder are at higher risk for developing this disorder, with or without life stressors. Major depressive disorder can occur at any age. It may occur just once in your life (single episode major depressive disorder). It may occur multiple times (recurrent major depressive disorder). SYMPTOMS People with major depressive disorder have either anhedonia or depressed mood on nearly a daily basis for at least 2 weeks or longer. Symptoms of depressed mood include:  Feelings of sadness (blue or down in the dumps) or emptiness.  Feelings of hopelessness or helplessness.  Tearfulness or episodes of crying (may be observed by others).  Irritability (children and adolescents). In addition to depressed mood or anhedonia or both, people with this disorder have at least four of the following symptoms:  Difficulty sleeping or sleeping too much.   Significant change (increase or decrease) in appetite or weight.   Lack of energy or  motivation.  Feelings of guilt and worthlessness.   Difficulty concentrating, remembering, or making decisions.  Unusually slow movement (psychomotor retardation) or restlessness (as observed by others).   Recurrent wishes for death, recurrent thoughts of self-harm (suicide), or a suicide attempt. People with major depressive disorder commonly have persistent negative thoughts about themselves, other people, and the world. People with severe major depressive disorder may experiencedistorted beliefs or perceptions about the world (psychotic delusions). They also may see or hear things that are not real (psychotic hallucinations). DIAGNOSIS Major depressive disorder is diagnosed through an assessment by your health care provider. Your health care provider will ask aboutaspects of your daily life, such as mood,sleep, and appetite, to see if you have the diagnostic symptoms of major depressive disorder. Your health care provider may ask about your medical history and use of alcohol or drugs, including prescription medicines. Your health care provider also may do a physical exam and blood work. This is because certain medical conditions and the use of certain substances can cause major depressive disorder-like symptoms (secondary depression). Your health care provider also may refer you to a mental health specialist for further evaluation and treatment. TREATMENT It is important to recognize the symptoms of major depressive disorder and seek treatment. The following treatments can be prescribed for this disorder:   Medicine. Antidepressant medicines usually are prescribed. Antidepressant medicines are thought to correct chemical imbalances in the brain that are commonly associated with major depressive disorder. Other types of medicine may be added if the symptoms do not respond to antidepressant medicines alone or if psychotic delusions or hallucinations occur.  Talk therapy. Talk therapy can be  helpful in treating major depressive disorder by providing   support, education, and guidance. Certain types of talk therapy also can help with negative thinking (cognitive behavioral therapy) and with relationship issues that trigger this disorder (interpersonal therapy). A mental health specialist can help determine which treatment is best for you. Most people with major depressive disorder do well with a combination of medicine and talk therapy. Treatments involving electrical stimulation of the brain can be used in situations with extremely severe symptoms or when medicine and talk therapy do not work over time. These treatments include electroconvulsive therapy, transcranial magnetic stimulation, and vagal nerve stimulation.   This information is not intended to replace advice given to you by your health care provider. Make sure you discuss any questions you have with your health care provider.   Document Released: 12/06/2012 Document Revised: 09/01/2014 Document Reviewed: 12/06/2012 Elsevier Interactive Patient Education 2016 Elsevier Inc.  

## 2015-11-20 NOTE — ED Notes (Signed)
TTS consulting at this time

## 2015-11-20 NOTE — ED Provider Notes (Addendum)
[facility] Emergency Department Provider Note     Time seen: ----------------------------------------- 7:59 AM on 11/20/2015 -----------------------------------------    I have reviewed the triage vital signs and the nursing notes.   HISTORY  Chief Complaint Depression    HPI Bridget Medina is a 50 y.o. female who presents to ER with depression for last 2 years, has been taking her medications but she feels like she needs additional help. Patient states "she is going through a lot of stuff right now". She denies suicidal or homicidal ideation. She presents here tearful. Denies any recent medical complaints.   Past Medical History  Diagnosis Date  . Asthma   . Cancer [facility]) 2011    breast    Patient Active Problem List   Diagnosis Date Noted  . Appendicitis with peritoneal abscess 05/25/2015  . Acute appendicitis with localized peritonitis   . Pelvic pain in female     Past Surgical History  Procedure Laterality Date  . Breast lumpectomy Right     Lumpectomy  . Ivad removal    . Laparoscopic appendectomy N/A 05/25/2015    Procedure: APPENDECTOMY LAPAROSCOPIC;  Surgeon: Florene Glen, MD;  Location: [facility] ORS;  Service: General;  Laterality: N/A;    Allergies Review of patient's allergies indicates no known allergies.  Social History Social History  Substance Use Topics  . Smoking status: Never Smoker   . Smokeless tobacco: Never Used  . Alcohol Use: Yes     Comment: Occasional    Review of Systems Constitutional: Negative for fever. Eyes: Negative for visual changes. ENT: Negative for sore throat. Cardiovascular: Negative for chest pain. Respiratory: Negative for shortness of breath. Gastrointestinal: Negative for abdominal pain, vomiting and diarrhea. Genitourinary: Negative for dysuria. Musculoskeletal: Negative for back pain. Skin: Negative for rash. Neurological: Negative for headaches, focal weakness or  numbness. Psychiatric: Positive for depression, negative for suicidal thought  10-point ROS otherwise negative.  ____________________________________________   PHYSICAL EXAM:  VITAL SIGNS: ED Triage Vitals  Enc Vitals Group     BP 11/20/15 0749 149/94 mmHg     Pulse Rate 11/20/15 0749 58     Resp 11/20/15 0749 18     Temp 11/20/15 0749 98.1 F (36.7 C)     Temp Source 11/20/15 0749 Oral     SpO2 11/20/15 0749 100 %     Weight 11/20/15 0749 119 lb (53.978 kg)     Height 11/20/15 0749 5\' 4"  (1.626 m)     Head Cir --      Peak Flow --      Pain Score --      Pain Loc --      Pain Edu? --      Excl. in [place]? --     Constitutional: Alert and oriented. Tearful Eyes: Conjunctivae are normal. PERRL. Normal extraocular movements. ENT   Head: Normocephalic and atraumatic.   Nose: No congestion/rhinnorhea.   Mouth/Throat: Mucous membranes are moist.   Neck: No stridor. Cardiovascular: Normal rate, regular rhythm. Normal and symmetric distal pulses are present in all extremities. No murmurs, rubs, or gallops. Respiratory: Normal respiratory effort without tachypnea nor retractions. Breath sounds are clear and equal bilaterally. No wheezes/rales/rhonchi. Gastrointestinal: Soft and nontender. No distention. No abdominal bruits.  Musculoskeletal: Nontender with normal range of motion in all extremities. No joint effusions.  No lower extremity tenderness nor edema. Neurologic:  Normal speech and language. No gross focal neurologic deficits are appreciated.  Skin:  Skin is warm, dry and  intact. No rash noted. Psychiatric: Depressed mood and affect, tears easily.  ____________________________________________  ED COURSE:  Pertinent labs & imaging results that were available during my care of the patient were reviewed by me and considered in my medical decision making (see chart for details). Patient clinically with depression, will consult psychiatry for further  evaluation. ____________________________________________    LABS (pertinent positives/negatives)  Labs Reviewed  CBC WITH DIFFERENTIAL/PLATELET - Abnormal; Notable for the following:    RBC 5.53 (*)    Hemoglobin 11.6 (*)    MCV 65.8 (*)    MCH 21.1 (*)    RDW 14.6 (*)    All other components within normal limits  COMPREHENSIVE METABOLIC PANEL - Abnormal; Notable for the following:    ALT 13 (*)    Alkaline Phosphatase 36 (*)    All other components within normal limits  URINALYSIS COMPLETEWITH MICROSCOPIC ([facility] ONLY) - Abnormal; Notable for the following:    Color, Urine YELLOW (*)    APPearance CLEAR (*)    Protein, ur 30 (*)    Squamous Epithelial / LPF 0-5 (*)    All other components within normal limits  TSH  ETHANOL  URINE DRUG SCREEN, QUALITATIVE ([facility] ONLY)  POC URINE PREG, ED  POCT PREGNANCY, URINE    ____________________________________________  FINAL ASSESSMENT AND PLAN  Major depressive disorder  Plan: Patient with labs as dictated above. Patient's no acute distress, medically cleared for psychiatric evaluation. Patient think she would benefit from hospitalization, we will discuss with psychiatry.   Earleen Newport, MD  Patient has been seen by psychiatry and cleared for discharge, a Remeron prescription has been written for. She will follow up with Dr. Gretel Acre. Earleen Newport, MD 11/20/15 [id number]  Earleen Newport, MD 11/20/15 1029  Earleen Newport, MD 11/20/15 [phone]

## 2015-11-20 NOTE — ED Notes (Signed)

## 2015-11-20 NOTE — ED Notes (Signed)
Lunch provided along with an extra drink   

## 2015-11-20 NOTE — ED Notes (Signed)
Pt here with hx of depression for 2 years now, has been taking her meds, however, going "through a lot of stuff right now," and wanting some help. Denies si/hi. Flat affect, tearful in triage.

## 2015-12-06 ENCOUNTER — Encounter: Payer: Self-pay | Admitting: Emergency Medicine

## 2015-12-06 ENCOUNTER — Emergency Department

## 2015-12-06 ENCOUNTER — Emergency Department
Admission: EM | Admit: 2015-12-06 | Discharge: 2015-12-07 | Disposition: A | Discharge status: Home / Self Care | Attending: Emergency Medicine | Admitting: Emergency Medicine

## 2015-12-06 DIAGNOSIS — M7591 Shoulder lesion, unspecified, right shoulder: Secondary | ICD-10-CM | POA: Diagnosis not present

## 2015-12-06 DIAGNOSIS — Z7951 Long term (current) use of inhaled steroids: Secondary | ICD-10-CM | POA: Diagnosis not present

## 2015-12-06 DIAGNOSIS — F331 Major depressive disorder, recurrent, moderate: Secondary | ICD-10-CM | POA: Diagnosis not present

## 2015-12-06 DIAGNOSIS — J45909 Unspecified asthma, uncomplicated: Secondary | ICD-10-CM | POA: Diagnosis not present

## 2015-12-06 DIAGNOSIS — M7581 Other shoulder lesions, right shoulder: Secondary | ICD-10-CM

## 2015-12-06 DIAGNOSIS — Z853 Personal history of malignant neoplasm of breast: Secondary | ICD-10-CM | POA: Diagnosis not present

## 2015-12-06 DIAGNOSIS — Z9011 Acquired absence of right breast and nipple: Secondary | ICD-10-CM | POA: Insufficient documentation

## 2015-12-06 DIAGNOSIS — M778 Other enthesopathies, not elsewhere classified: Secondary | ICD-10-CM

## 2015-12-06 DIAGNOSIS — M25511 Pain in right shoulder: Secondary | ICD-10-CM | POA: Diagnosis present

## 2015-12-06 MED ORDER — MELOXICAM 15 MG PO TABS: 15.0000 mg | ORAL_TABLET | Freq: Every day | ORAL | Status: DC

## 2015-12-06 NOTE — Discharge Instructions (Signed)
Calcific Tendinitis Calcific tendinitis occurs when crystals of calcium are deposited in a tendon. Tendons are bands of strong, fibrous tissue that attach muscles to bones. Tendons are an important part of joints. They make the joint move and they absorb some of the stress that a joint receives during use. When calcium is deposited in the tendon, the tendon becomes stiff, painful, and it can become swollen. Calcific tendinitis occurs frequently in the shoulder joint, in a structure called the rotator cuff. CAUSES  The cause of calcific tendinitis is unclear. It may be associated with:  Overuse of the tendon, such as from repetitive motion.  Excess stress on the tendon.  Aging.  Repetitive, mild injuries. SYMPTOMS   Pain may or may not be present. If it is present, it may occur when moving the joint.  Tenderness when pressure is applied to the tendon.  A snapping or popping sound when the joint moves.  Decreased motion of the joint.  Difficulty sleeping due to pain in the joint. DIAGNOSIS  Your health care provider will perform a physical exam. Imaging tests may also be used to make the diagnosis. These may include X-rays, an MRI, or a CT scan. TREATMENT  Generally, calcific tendinitis resolves on its own. Treatment for pain of calcific tendinitis may include:  Taking over-the-counter medicines, such as anti-inflammatory drugs.  Applying ice packs to the joint.  Following a specific exercise program to keep the joint working properly.  Attending physical therapy sessions.  Avoiding activities that cause pain. Treatment for more severe calcific tendinitis may require:  Injecting cortisone steroids or pain relieving medicines into or around the joint.  Manipulating the joint after you are given medicine to numb the area (local anesthetic).  Inflating the joint with sterile fluid to increase the flexibility of the tendons.  Shock wave therapy, which involves focusing sound  waves on the joint. If other treatments do not work, surgery may be done to clean out the calcium deposits and repair the tendons where needed. Most people do not need surgery. HOME CARE INSTRUCTIONS   Only take over-the-counter or prescription medicines for pain, fever, or discomfort as directed by your health care provider.  Follow your health care provider's recommendations for activity and exercise. SEEK MEDICAL CARE IF:  You notice an increase in pain or numbness.  You develop new weakness.  You notice increased joint stiffness or a sensation of looseness in the joint.  You notice increasing redness, swelling, or warmth around the joint area. SEEK IMMEDIATE MEDICAL CARE IF:  You have a fever or persistent symptoms for more than 2 to 3 days.  You have a fever and your symptoms suddenly get worse. MAKE SURE YOU:  Understand these instructions.  Will watch your condition.  Will get help right away if you are not doing well or get worse.   This information is not intended to replace advice given to you by your health care provider. Make sure you discuss any questions you have with your health care provider.   Document Released: 05/20/2008 Document Revised: 05/02/2015 Document Reviewed: 11/20/2011 Elsevier Interactive Patient Education [employer].

## 2015-12-06 NOTE — ED Provider Notes (Signed)
[facility] Emergency Department Provider Note  ____________________________________________  Time seen: Approximately 11:41 PM  I have reviewed the triage vital signs and the nursing notes.   HISTORY  Chief Complaint Shoulder Pain    HPI Bridget Medina is a 50 y.o. female who presents emergency department complaining of right shoulder pain. Per the patient she denies any recent injury to area. She does state that she was driving from [place] yesterday and the pain started while driving. She states that it is a burning sensation in the posterior shoulder. She denies any numbness or tingling in the right arm. She denies any neck pain. Patient has not take any medications prior to arrival.   Past Medical History  Diagnosis Date  . Asthma   . 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 MDD (major depressive disorder), recurrent episode, moderate ([place])   . Appendicitis with peritoneal abscess 05/25/2015  . Acute appendicitis with localized peritonitis   . Pelvic pain in female     Past Surgical History  Procedure Laterality Date  . Breast lumpectomy Right     Lumpectomy  . Ivad removal    . Laparoscopic appendectomy N/A 05/25/2015    Procedure: APPENDECTOMY LAPAROSCOPIC;  Surgeon: Florene Glen, MD;  Location: [facility] ORS;  Service: General;  Laterality: N/A;    Current Outpatient Rx  Name  Route  Sig  Dispense  Refill  . albuterol (PROVENTIL HFA;VENTOLIN HFA) 108 (90 BASE) MCG/ACT inhaler   Inhalation   Inhale 2 puffs into the lungs every 4 (four) hours as needed for wheezing or shortness of breath.         . ciprofloxacin (CIPRO) 500 MG tablet   Oral   Take 1 tablet (500 mg total) by mouth 2 (two) times daily. Patient not taking: Reported on 06/14/2015   20 tablet   1   . doxycycline (VIBRA-TABS) 100 MG tablet   Oral   Take 1 tablet (100 mg total) by mouth every 12 (twelve) hours. Patient not  taking: Reported on 06/14/2015   20 tablet   1   . meloxicam (MOBIC) 15 MG tablet   Oral   Take 1 tablet (15 mg total) by mouth daily.   30 tablet   0   . metroNIDAZOLE (FLAGYL) 500 MG tablet   Oral   Take 1 tablet (500 mg total) by mouth 3 (three) times daily. Patient not taking: Reported on 06/14/2015   30 tablet   1   . mirtazapine (REMERON SOLTAB) 15 MG disintegrating tablet   Oral   Take 1 tablet (15 mg total) by mouth at bedtime.   30 tablet   1     Allergies Review of patient's allergies indicates no known allergies.  Family History  Problem Relation Age of Onset  . Hypertension Mother     Social History Social History  Substance Use Topics  . Smoking status: Never Smoker   . Smokeless tobacco: Never Used  . Alcohol Use: Yes     Comment: Occasional     Review of Systems  Constitutional: No fever/chills Cardiovascular: no chest pain. Respiratory: no cough. No SOB. Musculoskeletal: Negative for back/Neck pain. Positive for right shoulder pain. Skin: Negative for rash. Neurological: Negative for headaches, focal weakness or numbness. 10-point ROS otherwise negative.  ____________________________________________   PHYSICAL EXAM:  VITAL SIGNS: ED Triage Vitals  Enc Vitals Group     BP 12/06/15  2153 117/72 mmHg     Pulse Rate 12/06/15 2153 46     Resp 12/06/15 2153 14     Temp 12/06/15 2153 98 F (36.7 C)     Temp Source 12/06/15 2153 Oral     SpO2 12/06/15 2153 99 %     Weight 12/06/15 2153 124 lb (56.246 kg)     Height 12/06/15 2153 5\' 4"  (1.626 m)     Head Cir --      Peak Flow --      Pain Score 12/06/15 2154 9     Pain Loc --      Pain Edu? --      Excl. in [place]? --      Constitutional: Alert and oriented. Well appearing and in no acute distress. Eyes: Conjunctivae are normal. PERRL. EOMI. Head: Atraumatic. Cardiovascular: Normal rate, regular rhythm. Normal S1 and S2.  Good peripheral circulation. Respiratory: Normal respiratory  effort without tachypnea or retractions. Lungs CTAB. Musculoskeletal: No visible deformity to right shoulder when compared with left. Patient has limited range of motion in all fields due to pain. Vision is full range of motion passively. Patient is diffusely tender to palpation over the posterior muscle group with no point tenderness. No palpable abnormality. Radial pulses appreciated distally. Sensation intact distally. Neurologic:  Normal speech and language. No gross focal neurologic deficits are appreciated.  Skin:  Skin is warm, dry and intact. No rash noted. Psychiatric: Mood and affect are normal. Speech and behavior are normal. Patient exhibits appropriate insight and judgement.   ____________________________________________   LABS (all labs ordered are listed, but only abnormal results are displayed)  Labs Reviewed - No data to display ____________________________________________  EKG   ____________________________________________  RADIOLOGY Diamantina Providence Riot Waterworth, personally viewed and evaluated these images (plain radiographs) as part of my medical decision making, as well as reviewing the written report by the radiologist.  Dg Shoulder Right  12/06/2015  CLINICAL DATA:  Progressive pain for 1 day. No history of recent trauma EXAM: RIGHT SHOULDER - 2+ VIEW COMPARISON:  None. FINDINGS: Frontal, Y scapular, and axillary images were obtained. There is no fracture or dislocation. No appreciable joint space narrowing. There is calcification near the greater tuberosity, likely calcific tendinosis. No erosive change. IMPRESSION: Probable calcific tendinosis near the greater tuberosity, best seen on the axillary view. No fracture or dislocation. No appreciable joint space narrowing. Electronically Signed   By: Lowella Grip III M.D.   On: 12/06/2015 23:29    ____________________________________________    PROCEDURES  Procedure(s) performed:       Medications - No data  to display   ____________________________________________   INITIAL IMPRESSION / ASSESSMENT AND PLAN / ED COURSE  Pertinent labs & imaging results that were available during my care of the patient were reviewed by me and considered in my medical decision making (see chart for details).  Patient's diagnosis is consistent with calcific tendinitis of the right shoulder. X-ray reveals calcium deposits and tendon. No acute osseous abnormality on x-ray. Exam is consistent with tendinitis of the right shoulder. This is likely secondary due to prolonged driving yesterday. Patient has no history of recurrent shoulder complaints. Exam is reassuring.. Patient will be discharged home with prescriptions for anti-inflammatories. Patient is given instructions to continue range of motion exercises to shoulder. Patient is to follow up with orthopedics if symptoms persist past this treatment course. Patient is given ED precautions to return to the ED for any worsening or new symptoms.  ____________________________________________  FINAL CLINICAL IMPRESSION(S) / ED DIAGNOSES  Final diagnoses:  Shoulder tendinitis, right      NEW MEDICATIONS STARTED DURING THIS VISIT:  New Prescriptions   MELOXICAM (MOBIC) 15 MG TABLET    Take 1 tablet (15 mg total) by mouth daily.        This chart was dictated using voice recognition software/Dragon. Despite best efforts to proofread, errors can occur which can change the meaning. Any change was purely unintentional.    Darletta Moll, PA-C 12/06/15 2346  Nance Pear, MD 12/06/15 2348

## 2015-12-06 NOTE — ED Notes (Addendum)
Pt c/o of right shoulder pain. Pt reports driving home from [place] yesterday, pain started while driving, pain has progressed since. Pt denies injury to shoulder.

## 2015-12-07 NOTE — ED Notes (Signed)
Reviewed d/c instructions, follow-up care, and prescriptions with patient. Pt verbalized understanding.  

## 2018-05-17 ENCOUNTER — Encounter: Payer: Self-pay | Admitting: Emergency Medicine

## 2018-05-17 ENCOUNTER — Emergency Department

## 2018-05-17 ENCOUNTER — Other Ambulatory Visit: Payer: Self-pay

## 2018-05-17 ENCOUNTER — Emergency Department
Admission: EM | Admit: 2018-05-17 | Discharge: 2018-05-17 | Disposition: A | Discharge status: Home / Self Care | Attending: Emergency Medicine | Admitting: Emergency Medicine

## 2018-05-17 DIAGNOSIS — Z79899 Other long term (current) drug therapy: Secondary | ICD-10-CM | POA: Insufficient documentation

## 2018-05-17 DIAGNOSIS — J45909 Unspecified asthma, uncomplicated: Secondary | ICD-10-CM | POA: Diagnosis not present

## 2018-05-17 DIAGNOSIS — J069 Acute upper respiratory infection, unspecified: Secondary | ICD-10-CM | POA: Insufficient documentation

## 2018-05-17 DIAGNOSIS — Z853 Personal history of malignant neoplasm of breast: Secondary | ICD-10-CM | POA: Insufficient documentation

## 2018-05-17 DIAGNOSIS — R05 Cough: Secondary | ICD-10-CM | POA: Diagnosis present

## 2018-05-17 DIAGNOSIS — B9789 Other viral agents as the cause of diseases classified elsewhere: Secondary | ICD-10-CM

## 2018-05-17 NOTE — ED Provider Notes (Signed)
[facility] Emergency Department Provider Note  ____________________________________________  Time seen: Approximately 11:04 PM  I have reviewed the triage vital signs and the nursing notes.   HISTORY  Chief Complaint Cough    HPI Bridget Medina is a 52 y.o. female presents to the emergency department with rhinorrhea, congestion, cough and myalgias for the past two to three days. Patient's daughter has experienced similar symptoms. No emesis or diarrhea. No fatigue, chest pain, chest tightness, shortness of breath, nausea, vomiting or abdominal pain.  Patient has been afebrile.   Past Medical History:  Diagnosis Date  . Asthma   . Cancer [facility]) 2011   breast    Patient Active Problem List   Diagnosis Date Noted  . MDD (major depressive disorder), recurrent episode, moderate ([place])   . Appendicitis with peritoneal abscess 05/25/2015  . Acute appendicitis with localized peritonitis   . Pelvic pain in female     Past Surgical History:  Procedure Laterality Date  . BREAST LUMPECTOMY Right    Lumpectomy  . IVAD removal    . LAPAROSCOPIC APPENDECTOMY N/A 05/25/2015   Procedure: APPENDECTOMY LAPAROSCOPIC;  Surgeon: Florene Glen, MD;  Location: [facility] ORS;  Service: General;  Laterality: N/A;    Prior to Admission medications   Medication Sig Start Date End Date Taking? Authorizing Provider  albuterol (PROVENTIL HFA;VENTOLIN HFA) 108 (90 BASE) MCG/ACT inhaler Inhale 2 puffs into the lungs every 4 (four) hours as needed for wheezing or shortness of breath.    [provider]  ciprofloxacin (CIPRO) 500 MG tablet Take 1 tablet (500 mg total) by mouth 2 (two) times daily. Patient not taking: Reported on 06/14/2015 05/27/15   Florene Glen, MD  doxycycline (VIBRA-TABS) 100 MG tablet Take 1 tablet (100 mg total) by mouth every 12 (twelve) hours. Patient not taking: Reported on 06/14/2015 05/27/15   Florene Glen, MD  meloxicam (MOBIC) 15 MG  tablet Take 1 tablet (15 mg total) by mouth daily. 12/06/15   Cuthriell, Charline Bills, PA-C  metroNIDAZOLE (FLAGYL) 500 MG tablet Take 1 tablet (500 mg total) by mouth 3 (three) times daily. Patient not taking: Reported on 06/14/2015 05/27/15   Florene Glen, MD  mirtazapine (REMERON SOLTAB) 15 MG disintegrating tablet Take 1 tablet (15 mg total) by mouth at bedtime. 11/20/15   Rainey Pines, MD    Allergies Patient has no known allergies.  Family History  Problem Relation Age of Onset  . Hypertension Mother     Social History Social History   Tobacco Use  . Smoking status: Never Smoker  . Smokeless tobacco: Never Used  Substance Use Topics  . Alcohol use: Yes    Comment: Occasional  . Drug use: No     Review of Systems  Constitutional: Patient has been afebrile.  Eyes: No visual changes. No discharge ENT: Patient has congestion.  Cardiovascular: no chest pain. Respiratory: Patient has cough.  Gastrointestinal: No abdominal pain.  No nausea, no vomiting. Patient had diarrhea.  Genitourinary: Negative for dysuria. No hematuria Musculoskeletal: Patient has myalgias.  Skin: Negative for rash, abrasions, lacerations, ecchymosis. Neurological: Patient has headache, no focal weakness or numbness.     ____________________________________________   PHYSICAL EXAM:  VITAL SIGNS: ED Triage Vitals [05/17/18 2013]  Enc Vitals Group     BP 135/74     Pulse Rate 76     Resp 18     Temp 98.1 F (36.7 C)     Temp Source Oral  SpO2 99 %     Weight 155 lb 3.3 oz (70.4 kg)     Height 5\' 4"  (1.626 m)     Head Circumference      Peak Flow      Pain Score 5     Pain Loc      Pain Edu?      Excl. in [place]?     Constitutional: Alert and oriented. Patient is lying supine. Eyes: Conjunctivae are normal. PERRL. EOMI. Head: Atraumatic. ENT:      Ears: Tympanic membranes are mildly injected with mild effusion bilaterally.       Nose: No congestion/rhinnorhea.       Mouth/Throat: Mucous membranes are moist. Posterior pharynx is mildly erythematous.  Hematological/Lymphatic/Immunilogical: No cervical lymphadenopathy.  Cardiovascular: Normal rate, regular rhythm. Normal S1 and S2.  Good peripheral circulation. Respiratory: Normal respiratory effort without tachypnea or retractions. Lungs CTAB. Good air entry to the bases with no decreased or absent breath sounds. Gastrointestinal: Bowel sounds 4 quadrants. Soft and nontender to palpation. No guarding or rigidity. No palpable masses. No distention. No CVA tenderness. Musculoskeletal: Full range of motion to all extremities. No gross deformities appreciated. Neurologic:  Normal speech and language. No gross focal neurologic deficits are appreciated.  Skin:  Skin is warm, dry and intact. No rash noted. Psychiatric: Mood and affect are normal. Speech and behavior are normal. Patient exhibits appropriate insight and judgement.    ____________________________________________   LABS (all labs ordered are listed, but only abnormal results are displayed)  Labs Reviewed - No data to display ____________________________________________  EKG   ____________________________________________  RADIOLOGY I personally viewed and evaluated these images as part of my medical decision making, as well as reviewing the written report by the radiologist.  Dg Chest 2 View  Result Date: 05/17/2018 CLINICAL DATA:  Cough for 1 week EXAM: CHEST - 2 VIEW COMPARISON:  None. FINDINGS: Normal heart size. Normal mediastinal contour. No pneumothorax. No pleural effusion. Lungs appear clear, with no acute consolidative airspace disease and no pulmonary edema. IMPRESSION: No active cardiopulmonary disease. Electronically Signed   By: Ilona Sorrel M.D.   On: 05/17/2018 21:16    ____________________________________________    PROCEDURES  Procedure(s) performed:    Procedures    Medications - No data to  display   ____________________________________________   INITIAL IMPRESSION / ASSESSMENT AND PLAN / ED COURSE  Pertinent labs & imaging results that were available during my care of the patient were reviewed by me and considered in my medical decision making (see chart for details).  Review of the [place] CSRS was performed in accordance of the [place] prior to dispensing any controlled drugs.    Assessment and Plan:  Viral URI with cough Patient presents to the emergency department with rhinorrhea, congestion and nonproductive cough for the past 2 to 3 days.  Patient has known sick contacts at home.  Vital signs remained reassuring throughout emergency department course.  Rest and hydration were encouraged.  Patient was advised to follow-up with primary care as needed.  All patient questions were answered.     ____________________________________________  FINAL CLINICAL IMPRESSION(S) / ED DIAGNOSES  Final diagnoses:  Viral URI with cough      NEW MEDICATIONS STARTED DURING THIS VISIT:  ED Discharge Orders    None          This chart was dictated using voice recognition software/Dragon. Despite best efforts to proofread, errors can occur which can change the meaning. Any change was  purely unintentional.    Lannie Fields, PA-C 05/17/18 2309    Harvest Dark, MD 05/17/18 [phone]

## 2018-05-17 NOTE — ED Triage Notes (Signed)
Patient ambulatory to triage with steady gait, without difficulty or distress noted; pt reports body aches, nonprod cough x wk; here with daughter also to be seen for same

## 2018-05-17 NOTE — ED Notes (Signed)
Pt stated that symptoms started last week. Symptoms included a productive cough, sore throat, and a HA.

## 2018-08-10 ENCOUNTER — Emergency Department

## 2018-08-10 ENCOUNTER — Emergency Department
Admission: EM | Admit: 2018-08-10 | Discharge: 2018-08-10 | Disposition: A | Discharge status: Home / Self Care | Attending: Emergency Medicine | Admitting: Emergency Medicine

## 2018-08-10 ENCOUNTER — Encounter: Payer: Self-pay | Admitting: Emergency Medicine

## 2018-08-10 ENCOUNTER — Other Ambulatory Visit: Payer: Self-pay

## 2018-08-10 DIAGNOSIS — Z853 Personal history of malignant neoplasm of breast: Secondary | ICD-10-CM | POA: Insufficient documentation

## 2018-08-10 DIAGNOSIS — Y9301 Activity, walking, marching and hiking: Secondary | ICD-10-CM | POA: Insufficient documentation

## 2018-08-10 DIAGNOSIS — Y998 Other external cause status: Secondary | ICD-10-CM | POA: Diagnosis not present

## 2018-08-10 DIAGNOSIS — W228XXA Striking against or struck by other objects, initial encounter: Secondary | ICD-10-CM | POA: Diagnosis not present

## 2018-08-10 DIAGNOSIS — Y929 Unspecified place or not applicable: Secondary | ICD-10-CM | POA: Diagnosis not present

## 2018-08-10 DIAGNOSIS — S99921A Unspecified injury of right foot, initial encounter: Secondary | ICD-10-CM | POA: Diagnosis present

## 2018-08-10 DIAGNOSIS — J45909 Unspecified asthma, uncomplicated: Secondary | ICD-10-CM | POA: Diagnosis not present

## 2018-08-10 DIAGNOSIS — M79671 Pain in right foot: Secondary | ICD-10-CM

## 2018-08-10 DIAGNOSIS — S90121A Contusion of right lesser toe(s) without damage to nail, initial encounter: Secondary | ICD-10-CM | POA: Diagnosis not present

## 2018-08-10 NOTE — ED Notes (Signed)
States she kicked a rug  Having pain to lateral 5 th toe and under toe

## 2018-08-10 NOTE — ED Provider Notes (Addendum)
[facility] Emergency Department Provider Note ____________________________________________  Time seen: 65  I have reviewed the triage vital signs and the nursing notes.  HISTORY  Chief Complaint  Toe Pain  HPI Bridget Medina is a 52 y.o. female presents to the ED for evaluation of right foot pain at the base of the fifth toe.  Patient describes that she accidentally kicked a large heavy rug, causing immediate pain to the left pinky toe.  She gives a history of at least 2 previous pinky toe fractures on the right foot.  He denies any other injury at this time.  She describes pain with attempts to push off on the balls of the toes.  Past Medical History:  Diagnosis Date  . Asthma   . Cancer [facility]) 2011   breast    Patient Active Problem List   Diagnosis Date Noted  . MDD (major depressive disorder), recurrent episode, moderate ([place])   . Appendicitis with peritoneal abscess 05/25/2015  . Acute appendicitis with localized peritonitis   . Pelvic pain in female     Past Surgical History:  Procedure Laterality Date  . BREAST LUMPECTOMY Right    Lumpectomy  . IVAD removal    . LAPAROSCOPIC APPENDECTOMY N/A 05/25/2015   Procedure: APPENDECTOMY LAPAROSCOPIC;  Surgeon: Florene Glen, MD;  Location: [facility] ORS;  Service: General;  Laterality: N/A;    Prior to Admission medications   Medication Sig Start Date End Date Taking? Authorizing Provider  albuterol (PROVENTIL HFA;VENTOLIN HFA) 108 (90 BASE) MCG/ACT inhaler Inhale 2 puffs into the lungs every 4 (four) hours as needed for wheezing or shortness of breath.    [provider]    Allergies Patient has no known allergies.  Family History  Problem Relation Age of Onset  . Hypertension Mother     Social History Social History   Tobacco Use  . Smoking status: Never Smoker  . Smokeless tobacco: Never Used  Substance Use Topics  . Alcohol use: Yes    Comment: Occasional  . Drug use: No     Review of Systems  Constitutional: Negative for fever. Cardiovascular: Negative for chest pain. Respiratory: Negative for shortness of breath. Musculoskeletal: Negative for back pain. Right 5th toe/lateral foot pain Skin: Negative for rash. Neurological: Negative for headaches, focal weakness or numbness. ____________________________________________  PHYSICAL EXAM:  VITAL SIGNS: ED Triage Vitals  Enc Vitals Group     BP 08/10/18 1803 (!) 127/94     Pulse Rate 08/10/18 1803 64     Resp 08/10/18 1803 18     Temp 08/10/18 1803 98.5 F (36.9 C)     Temp Source 08/10/18 1803 Oral     SpO2 08/10/18 1803 98 %     Weight 08/10/18 1759 148 lb (67.1 kg)     Height 08/10/18 1759 5\' 4"  (1.626 m)     Head Circumference --      Peak Flow --      Pain Score 08/10/18 1758 8     Pain Loc --      Pain Edu? --      Excl. in [place]? --     Constitutional: Alert and oriented. Well appearing and in no distress. Head: Normocephalic and atraumatic. Eyes: Conjunctivae are normal. Normal extraocular movements Cardiovascular: Normal rate, regular rhythm. Normal distal pulses. Respiratory: Normal respiratory effort.  Musculoskeletal: Nontender with normal range of motion in all extremities.  Neurologic:  Normal gait without ataxia. Normal speech and language. No gross focal neurologic  deficits are appreciated. Skin:  Skin is warm, dry and intact. No rash noted. ____________________________________________   RADIOLOGY  Right 5th Toe Negative  I, Menshew, Dannielle Karvonen, personally viewed and evaluated these images (plain radiographs) as part of my medical decision making, as well as reviewing the written report by the radiologist. ____________________________________________  PROCEDURES  Procedures ____________________________________________  INITIAL IMPRESSION / ASSESSMENT AND PLAN / ED COURSE  Patient with ED evaluation of right fifth toe and lateral foot pain after mechanical  injury.  Patient with previous toe fractures is concerned for similar injury after she accidentally kicked a large heavy rug.  No radiologic evidence of fracture or dislocation.  Patient will wear her previously prescribed cam walker for comfort and support patient is referred to podiatry for ongoing symptom management. ____________________________________________  FINAL CLINICAL IMPRESSION(S) / ED DIAGNOSES  Final diagnoses:  Contusion of fifth toe of right foot, initial encounter  Foot pain, right      Melvenia Needles, PA-C 08/10/18 1841    Orbie Pyo, MD 08/10/18 2043  Patient discharged with a diagnosis of contusion.  However, there appears to be a fracture in the right fifth middle phalangeal base extending into the PIP joint.  Alerted that this finding by Kris Mouton.  Patient to be called to update her regarding this finding as well as to follow-up with podiatry.  Patient had already been counseled by PA to wear a hard soled shoe which she had already had at home.   Orbie Pyo, MD 08/12/18 1524

## 2018-08-10 NOTE — Discharge Instructions (Signed)
Your exam is consistent with a toe sprain and contusion. There is no evidence of fracture. Wear your boot for support. Follow-up with Dr. Vickki Muff for ongoing pain.

## 2018-08-10 NOTE — ED Triage Notes (Signed)
States she kicked a rug  Having pain to right 5 th toe

## 2018-08-12 ENCOUNTER — Telehealth: Payer: Self-pay | Admitting: Emergency Medicine

## 2018-08-12 NOTE — Telephone Encounter (Signed)
Called patient due to xray resulted after discharge from ED.  Result reviewed by dr Clearnce Hasten.  No change in treatment--wear the shoe, and follow up with the podiatrist.  Called patient and informed her of result.

## 2018-09-17 ENCOUNTER — Encounter (HOSPITAL_BASED_OUTPATIENT_CLINIC_OR_DEPARTMENT_OTHER): Payer: Self-pay | Admitting: *Deleted

## 2018-09-17 ENCOUNTER — Other Ambulatory Visit: Payer: Self-pay

## 2018-09-17 ENCOUNTER — Emergency Department (HOSPITAL_BASED_OUTPATIENT_CLINIC_OR_DEPARTMENT_OTHER)

## 2018-09-17 ENCOUNTER — Emergency Department (HOSPITAL_BASED_OUTPATIENT_CLINIC_OR_DEPARTMENT_OTHER)
Admission: EM | Admit: 2018-09-17 | Discharge: 2018-09-17 | Disposition: A | Discharge status: Home / Self Care | Attending: Emergency Medicine | Admitting: Emergency Medicine

## 2018-09-17 DIAGNOSIS — R1030 Lower abdominal pain, unspecified: Secondary | ICD-10-CM | POA: Insufficient documentation

## 2018-09-17 DIAGNOSIS — R51 Headache: Secondary | ICD-10-CM | POA: Diagnosis not present

## 2018-09-17 DIAGNOSIS — J45909 Unspecified asthma, uncomplicated: Secondary | ICD-10-CM | POA: Diagnosis not present

## 2018-09-17 DIAGNOSIS — R519 Headache, unspecified: Secondary | ICD-10-CM

## 2018-09-17 LAB — COMPREHENSIVE METABOLIC PANEL
ALT: 16 U/L (ref 0–44)
ANION GAP: 6 (ref 5–15)
AST: 17 U/L (ref 15–41)
Albumin: 3.9 g/dL (ref 3.5–5.0)
Alkaline Phosphatase: 46 U/L (ref 38–126)
BUN: 24 mg/dL — AB (ref 6–20)
CHLORIDE: 110 mmol/L (ref 98–111)
CO2: 26 mmol/L (ref 22–32)
Calcium: 9.1 mg/dL (ref 8.9–10.3)
Creatinine, Ser: 0.76 mg/dL (ref 0.44–1.00)
GFR calc Af Amer: >60 mL/min (ref >60)
Glucose, Bld: 105 mg/dL — ABNORMAL HIGH (ref 70–99)
POTASSIUM: 3.7 mmol/L (ref 3.5–5.1)
Sodium: 142 mmol/L (ref 135–145)
Total Bilirubin: 0.4 mg/dL (ref 0.3–1.2)
Total Protein: 7 g/dL (ref 6.5–8.1)

## 2018-09-17 LAB — LIPASE, BLOOD: LIPASE: 32 U/L (ref 11–51)

## 2018-09-17 LAB — URINALYSIS, ROUTINE W REFLEX MICROSCOPIC
Bilirubin Urine: NEGATIVE
Glucose, UA: NEGATIVE mg/dL
HGB URINE DIPSTICK: NEGATIVE
KETONES UR: NEGATIVE mg/dL
LEUKOCYTES UA: NEGATIVE
Nitrite: NEGATIVE
PROTEIN: NEGATIVE mg/dL
Specific Gravity, Urine: 1.02 (ref 1.005–1.030)
pH: 6.5 (ref 5.0–8.0)

## 2018-09-17 LAB — CBC
HEMATOCRIT: 35.2 % — AB (ref 36.0–46.0)
HEMOGLOBIN: 10.9 g/dL — AB (ref 12.0–15.0)
MCH: 20.5 pg — AB (ref 26.0–34.0)
MCHC: 31 g/dL (ref 30.0–36.0)
MCV: 66 fL — ABNORMAL LOW (ref 80.0–100.0)
NRBC: 0 % (ref 0.0–0.2)
Platelets: 328 10*3/uL (ref 150–400)
RBC: 5.33 MIL/uL — ABNORMAL HIGH (ref 3.87–5.11)
RDW: 14.7 % (ref 11.5–15.5)
WBC: 6.1 10*3/uL (ref 4.0–10.5)

## 2018-09-17 MED ORDER — IOPAMIDOL (ISOVUE-M 300) INJECTION 61%: 15.0000 mL | Freq: Once | INTRAMUSCULAR | Status: DC | PRN

## 2018-09-17 MED ORDER — SODIUM CHLORIDE 0.9 % IV BOLUS: 1000.0000 mL | Freq: Once | INTRAVENOUS | Status: DC

## 2018-09-17 NOTE — Discharge Instructions (Addendum)
It was recommended that you stay in the emergency department for CT scans of your brain as well as your abdomen.  We discussed that without these, certain serious and possibly life-threatening conditions such as stroke, cancers, infection, etc. could be missed because of this delay.  If at any point you get worse or change your mind, you are encouraged to return.  Otherwise, follow-up very closely with your primary care physician.  If you develop worsening, continued, or recurrent abdominal pain, uncontrolled vomiting, fever, chest or back pain, or any other new/concerning symptoms then return to the ER for evaluation.  If you develop continued, recurrent, or worsening headache, fever, neck stiffness, vomiting, blurry or double vision, weakness or numbness in your arms or legs, trouble speaking, or any other new/concerning symptoms then return to the ER for evaluation.

## 2018-09-17 NOTE — ED Notes (Signed)
EDP at bedside to talk to patient about risk of refusal. Pt decided that she still does not want to stay.

## 2018-09-17 NOTE — ED Notes (Signed)
Pt refused at this time; RN will call and let us know if pt wants scans done.

## 2018-09-17 NOTE — ED Notes (Signed)
Pt refused IV access and the fIv fluids. States she needs to go home.  Adam, RN made aware.

## 2018-09-17 NOTE — ED Provider Notes (Signed)
[facility] EMERGENCY DEPARTMENT Provider Note   CSN: [id number] Arrival date & time: 09/17/18  1524     History   Chief Complaint Chief Complaint  Patient presents with  . Abdominal Pain    HPI Bridget Medina is a 53 y.o. female.  HPI  53 year old female presents with abdominal pain and headache and left arm tingling/numbness.  She states that she did the keto diet starting last month and 2 weeks ago stopped.  Essentially as soon as she stopped she has been noticing on and off headaches and on and off lower abdominal pain.  Often feel like she needs to go to the bathroom but cannot.  She has been having regular bowel movements however.  No vomiting or diarrhea.  The headache seems to come and go whenever she eats starch or sugar.  The abdominal pain comes at the same time.  The tingling in her left shoulder and upper arm has been coming and going with a headache only for the last 4 or 5 days.  No weakness.  No blurry vision, trouble speaking, chest pain. She is currently feeling the sensation in her LUE. All of her symptoms are mild to moderate and she declines meds at this time.  Past Medical History:  Diagnosis Date  . Asthma   . Cancer [facility]) 2011   breast    Patient Active Problem List   Diagnosis Date Noted  . MDD (major depressive disorder), recurrent episode, moderate ([place])   . Appendicitis with peritoneal abscess 05/25/2015  . Acute appendicitis with localized peritonitis   . Pelvic pain in female     Past Surgical History:  Procedure Laterality Date  . BREAST LUMPECTOMY Right    Lumpectomy  . IVAD removal    . LAPAROSCOPIC APPENDECTOMY N/A 05/25/2015   Procedure: APPENDECTOMY LAPAROSCOPIC;  Surgeon: Florene Glen, MD;  Location: [facility] ORS;  Service: General;  Laterality: N/A;     OB History   No obstetric history on file.      Home Medications    Prior to Admission medications   Medication Sig Start Date End Date Taking? Authorizing Provider   albuterol (PROVENTIL HFA;VENTOLIN HFA) 108 (90 BASE) MCG/ACT inhaler Inhale 2 puffs into the lungs every 4 (four) hours as needed for wheezing or shortness of breath.   Yes [provider]    Family History Family History  Problem Relation Age of Onset  . Hypertension Mother     Social History Social History   Tobacco Use  . Smoking status: Never Smoker  . Smokeless tobacco: Never Used  Substance Use Topics  . Alcohol use: Yes    Comment: Occasional  . Drug use: No     Allergies   Patient has no known allergies.   Review of Systems Review of Systems  Constitutional: Negative for fever.  Eyes: Negative for visual disturbance.  Respiratory: Negative for shortness of breath.   Cardiovascular: Negative for chest pain.  Gastrointestinal: Positive for abdominal pain. Negative for diarrhea, nausea and vomiting.  Musculoskeletal: Negative for neck pain.  Neurological: Positive for numbness and headaches. Negative for dizziness, speech difficulty and weakness.  All other systems reviewed and are negative.    Physical Exam Updated Vital Signs BP 135/79 (BP Location: Right Arm)   Pulse (!) 48   Temp 98.2 F (36.8 C) (Oral)   Resp 17   Ht 5\' 4"  (1.626 m)   Wt 66.7 kg   LMP 11/23/2016 Comment: neg upreg in er today  SpO2 97%   BMI 25.23 kg/m   Physical Exam Vitals signs and nursing note reviewed.  Constitutional:      General: She is not in acute distress.    Appearance: She is well-developed. She is not ill-appearing or diaphoretic.  HENT:     Head: Normocephalic and atraumatic.     Right Ear: External ear normal.     Left Ear: External ear normal.     Nose: Nose normal.  Eyes:     General:        Right eye: No discharge.        Left eye: No discharge.  Cardiovascular:     Rate and Rhythm: Regular rhythm. Bradycardia present.     Pulses:          Radial pulses are 2+ on the right side and 2+ on the left side.       Dorsalis pedis pulses are 2+ on  the right side and 2+ on the left side.     Heart sounds: Normal heart sounds.  Pulmonary:     Effort: Pulmonary effort is normal.     Breath sounds: Normal breath sounds.  Abdominal:     Palpations: Abdomen is soft.     Tenderness: There is abdominal tenderness (mild) in the right lower quadrant, suprapubic area and left lower quadrant.  Skin:    General: Skin is warm and dry.  Neurological:     Mental Status: She is alert.     Comments: CN 3-12 grossly intact. 5/5 strength in all 4 extremities. Grossly normal sensation. Normal finger to nose.   Psychiatric:        Mood and Affect: Mood is not anxious.      ED Treatments / Results  Labs (all labs ordered are listed, but only abnormal results are displayed) Labs Reviewed  COMPREHENSIVE METABOLIC PANEL - Abnormal; Notable for the following components:      Result Value   Glucose, Bld 105 (*)    BUN 24 (*)    All other components within normal limits  CBC - Abnormal; Notable for the following components:   RBC 5.33 (*)    Hemoglobin 10.9 (*)    HCT 35.2 (*)    MCV 66.0 (*)    MCH 20.5 (*)    All other components within normal limits  URINALYSIS, ROUTINE W REFLEX MICROSCOPIC  LIPASE, BLOOD    EKG EKG Interpretation  Date/Time:  Friday September 17 2018 17:22:44 EST Ventricular Rate:  49 PR Interval:    QRS Duration: 95 QT Interval:  466 QTC Calculation: 421 R Axis:   71 Text Interpretation:  Sinus bradycardia no acute ST/T changes No old tracing to compare Confirmed by Sherwood Gambler [phone]) on 09/17/2018 5:30:45 PM   Radiology No results found.  Procedures Procedures (including critical care time)  Medications Ordered in ED Medications  sodium chloride 0.9 % bolus 1,000 mL (has no administration in time range)  iopamidol (ISOVUE-M) 61 % intrathecal injection 15 mL (has no administration in time range)     Initial Impression / Assessment and Plan / ED Course  I have reviewed the triage vital signs and the  nursing notes.  Pertinent labs & imaging results that were available during my care of the patient were reviewed by me and considered in my medical decision making (see chart for details).     Patient's lab work is reassuring.  Her exam overall shows some mild lower abdominal tenderness diffusely.  She is  not having any GU complaints.  As far as her on and off headache and vague left arm tingling, I recommended CT.  Also planned CT of the abdomen and pelvis.  However just prior to her getting these procedures and before the IV was placed she now states she wants to leave.  She states she did not think she is going to be here this long and needs to go home.  I discussed possible etiologies that we are looking for on the CT including stroke/bleed, mass (especially with her prior breast cancer history), as well as infections or obstruction in her abdomen.  She understands possible consequences of delay in diagnosis and states she will go back to the ER at a different time.  She does appear stable however and has had symptoms for quite some time and so is reasonable to let her go follow-up closely with PCP.  We discussed she is encouraged to and may return at any point.  Final Clinical Impressions(s) / ED Diagnoses   Final diagnoses:  Frontal headache  Lower abdominal pain    ED Discharge Orders    None       Sherwood Gambler, MD 09/17/18 1800

## 2018-09-17 NOTE — ED Triage Notes (Signed)
Abdominal pain and numbness in her left arm x 2 weeks. States it started after stopping a Keto diet she had been on x 2 weeks.

## 2019-06-18 ENCOUNTER — Other Ambulatory Visit: Payer: Self-pay

## 2019-06-18 ENCOUNTER — Emergency Department

## 2019-06-18 ENCOUNTER — Emergency Department
Admission: EM | Admit: 2019-06-18 | Discharge: 2019-06-18 | Disposition: A | Discharge status: Home / Self Care | Attending: Emergency Medicine | Admitting: Emergency Medicine

## 2019-06-18 DIAGNOSIS — S62102A Fracture of unspecified carpal bone, left wrist, initial encounter for closed fracture: Secondary | ICD-10-CM

## 2019-06-18 DIAGNOSIS — J45909 Unspecified asthma, uncomplicated: Secondary | ICD-10-CM | POA: Insufficient documentation

## 2019-06-18 DIAGNOSIS — Y92838 Other recreation area as the place of occurrence of the external cause: Secondary | ICD-10-CM | POA: Insufficient documentation

## 2019-06-18 DIAGNOSIS — Y9302 Activity, running: Secondary | ICD-10-CM | POA: Insufficient documentation

## 2019-06-18 DIAGNOSIS — Y999 Unspecified external cause status: Secondary | ICD-10-CM | POA: Insufficient documentation

## 2019-06-18 DIAGNOSIS — W010XXA Fall on same level from slipping, tripping and stumbling without subsequent striking against object, initial encounter: Secondary | ICD-10-CM | POA: Diagnosis not present

## 2019-06-18 DIAGNOSIS — S52592A Other fractures of lower end of left radius, initial encounter for closed fracture: Secondary | ICD-10-CM | POA: Insufficient documentation

## 2019-06-18 DIAGNOSIS — I959 Hypotension, unspecified: Secondary | ICD-10-CM | POA: Diagnosis not present

## 2019-06-18 DIAGNOSIS — Z79899 Other long term (current) drug therapy: Secondary | ICD-10-CM | POA: Insufficient documentation

## 2019-06-18 DIAGNOSIS — R55 Syncope and collapse: Secondary | ICD-10-CM

## 2019-06-18 LAB — CBC WITH DIFFERENTIAL/PLATELET
Abs Immature Granulocytes: 0.02 10*3/uL (ref 0.00–0.07)
Basophils Absolute: 0.1 10*3/uL (ref 0.0–0.1)
Basophils Relative: 1 %
Eosinophils Absolute: 0.1 10*3/uL (ref 0.0–0.5)
Eosinophils Relative: 1 %
HCT: 36.3 % (ref 36.0–46.0)
Hemoglobin: 11.4 g/dL — ABNORMAL LOW (ref 12.0–15.0)
Immature Granulocytes: 0 %
Lymphocytes Relative: 37 %
Lymphs Abs: 2.6 10*3/uL (ref 0.7–4.0)
MCH: 20.5 pg — ABNORMAL LOW (ref 26.0–34.0)
MCHC: 31.4 g/dL (ref 30.0–36.0)
MCV: 65.2 fL — ABNORMAL LOW (ref 80.0–100.0)
Monocytes Absolute: 0.4 10*3/uL (ref 0.1–1.0)
Monocytes Relative: 6 %
Neutro Abs: 3.8 10*3/uL (ref 1.7–7.7)
Neutrophils Relative %: 55 %
Platelets: 332 10*3/uL (ref 150–400)
RBC: 5.57 MIL/uL — ABNORMAL HIGH (ref 3.87–5.11)
RDW: 14.7 % (ref 11.5–15.5)
WBC: 7 10*3/uL (ref 4.0–10.5)
nRBC: 0 % (ref 0.0–0.2)

## 2019-06-18 LAB — COMPREHENSIVE METABOLIC PANEL
ALT: 15 U/L (ref 0–44)
AST: 21 U/L (ref 15–41)
Albumin: 4.2 g/dL (ref 3.5–5.0)
Alkaline Phosphatase: 50 U/L (ref 38–126)
Anion gap: 12 (ref 5–15)
BUN: 19 mg/dL (ref 6–20)
CO2: 24 mmol/L (ref 22–32)
Calcium: 9.7 mg/dL (ref 8.9–10.3)
Chloride: 106 mmol/L (ref 98–111)
Creatinine, Ser: 1.21 mg/dL — ABNORMAL HIGH (ref 0.44–1.00)
GFR calc Af Amer: 60 mL/min — ABNORMAL LOW (ref >60)
GFR calc non Af Amer: 52 mL/min — ABNORMAL LOW (ref >60)
Glucose, Bld: 144 mg/dL — ABNORMAL HIGH (ref 70–99)
Potassium: 4.6 mmol/L (ref 3.5–5.1)
Sodium: 142 mmol/L (ref 135–145)
Total Bilirubin: 0.9 mg/dL (ref 0.3–1.2)
Total Protein: 7.3 g/dL (ref 6.5–8.1)

## 2019-06-18 LAB — TROPONIN I (HIGH SENSITIVITY)
Troponin I (High Sensitivity): 4 ng/L (ref <18)
Troponin I (High Sensitivity): 4 ng/L (ref <18)

## 2019-06-18 MED ORDER — SODIUM CHLORIDE 0.9 % IV SOLN
1000.0000 mL | Freq: Once | INTRAVENOUS | Status: AC
  Administered 2019-06-18: 11:00:00 1000 mL via INTRAVENOUS

## 2019-06-18 MED ORDER — FENTANYL CITRATE (PF) 100 MCG/2ML IJ SOLN
50.0000 ug | INTRAMUSCULAR | Status: DC | PRN
  Administered 2019-06-18: 50 ug via INTRAVENOUS
  Filled 2019-06-18: qty 2

## 2019-06-18 NOTE — ED Triage Notes (Signed)
[place], pt states that she was running on her 8th mile and started to feel light headed and she started to walk then passed out, states she was trying to get up and couldn't, her neighbors saw her and called 911, pt reports pain in her left wrist and ems reports deformity reported to them and was wrapped and splinted prior to their arrival. Pt also has an abrasion to her upper lip, rt elbow, as well, pt is wet from diaphoresis. Pt states that other than the pain she is feeling some better. Pt states this has happened recently.

## 2019-06-18 NOTE — ED Provider Notes (Signed)
[facility] Emergency Department Provider Note   ____________________________________________    I have reviewed the triage vital signs and the nursing notes.   HISTORY  Chief Complaint Loss of Consciousness and Hypotension     HPI Bridget Medina is a 54 y.o. female who presents after syncopal episode.  Patient reports that she was running her she often does and had ran approximately 8 miles when she started to feel lightheaded.  She stopped running but continued feeling lightheaded and like she might faint.  She went down on her knee but apparently then collapsed onto her side.  She complains of left wrist pain but no other injuries reported.  Denies chest pain or palpitations.  No shortness of breath.  She reports this is happened before with significant exertion has not had it worked up with cardiology.  Past Medical History:  Diagnosis Date  . Asthma   . Cancer [facility]) 2011   breast    Patient Active Problem List   Diagnosis Date Noted  . MDD (major depressive disorder), recurrent episode, moderate ([place])   . Appendicitis with peritoneal abscess 05/25/2015  . Acute appendicitis with localized peritonitis   . Pelvic pain in female     Past Surgical History:  Procedure Laterality Date  . BREAST LUMPECTOMY Right    Lumpectomy  . IVAD removal    . LAPAROSCOPIC APPENDECTOMY N/A 05/25/2015   Procedure: APPENDECTOMY LAPAROSCOPIC;  Surgeon: Florene Glen, MD;  Location: [facility] ORS;  Service: General;  Laterality: N/A;    Prior to Admission medications   Medication Sig Start Date End Date Taking? Authorizing Provider  albuterol (PROVENTIL HFA;VENTOLIN HFA) 108 (90 BASE) MCG/ACT inhaler Inhale 2 puffs into the lungs every 4 (four) hours as needed for wheezing or shortness of breath.    [provider]     Allergies Patient has no known allergies.  Family History  Problem Relation Age of Onset  . Hypertension Mother     Social  History Social History   Tobacco Use  . Smoking status: Never Smoker  . Smokeless tobacco: Never Used  Substance Use Topics  . Alcohol use: Yes    Comment: Occasional  . Drug use: No    Review of Systems  Constitutional: No fever/chills Eyes: No visual changes.  ENT: No sore throat. Cardiovascular: As above Respiratory: As above Gastrointestinal: No abdominal pain.  No nausea, no vomiting.   Genitourinary: Negative for dysuria. Musculoskeletal: Left wrist pain Skin: Negative for rash. Neurological: Negative for headaches or weakness   ____________________________________________   PHYSICAL EXAM:  VITAL SIGNS: ED Triage Vitals  Enc Vitals Group     BP 06/18/19 1021 (!) 83/43     Pulse Rate 06/18/19 1021 (!) 47     Resp 06/18/19 1021 18     Temp 06/18/19 1021 (!) 97.4 F (36.3 C)     Temp Source 06/18/19 1021 Oral     SpO2 06/18/19 1021 97 %     Weight 06/18/19 1022 70.8 kg (156 lb)     Height 06/18/19 1022 1.626 m (5\' 4" )     Head Circumference --      Peak Flow --      Pain Score 06/18/19 1022 7     Pain Loc --      Pain Edu? --      Excl. in [place]? --     Constitutional: Alert and oriented. No acute distress.  Nose: No congestion/rhinnorhea. Mouth/Throat: Mucous membranes are moist.  Neck: No vertebral has palpation, no pain with axial load Cardiovascular: Normal rate, regular rhythm. Grossly normal heart sounds.  Good peripheral circulation. Respiratory: Normal respiratory effort.  No retractions. Lungs CTAB. Gastrointestinal: Soft and nontender. No distention.  No CVA tenderness. Genitourinary: deferred Musculoskeletal: Left wrist with swelling and tenderness, no puncture wounds, normal cap refill Neurologic:  No gross focal neurologic deficits are appreciated.  Skin:  Skin is warm, dry and intact. No rash noted. Psychiatric: Mood and affect are normal. Speech and behavior are normal.  ____________________________________________   LABS (all labs  ordered are listed, but only abnormal results are displayed)  Labs Reviewed  CBC WITH DIFFERENTIAL/PLATELET - Abnormal; Notable for the following components:      Result Value   RBC 5.57 (*)    Hemoglobin 11.4 (*)    MCV 65.2 (*)    MCH 20.5 (*)    All other components within normal limits  COMPREHENSIVE METABOLIC PANEL - Abnormal; Notable for the following components:   Glucose, Bld 144 (*)    Creatinine, Ser 1.21 (*)    GFR calc non Af Amer 52 (*)    GFR calc Af Amer 60 (*)    All other components within normal limits  TROPONIN I (HIGH SENSITIVITY)  TROPONIN I (HIGH SENSITIVITY)   ____________________________________________  EKG  ED ECG REPORT I, Lavonia Drafts, the attending physician, personally viewed and interpreted this ECG.  Date: 06/18/2019  Rhythm: normal sinus rhythm QRS Axis: normal Intervals: normal ST/T Wave abnormalities: normal Narrative Interpretation: no evidence of acute ischemia  ____________________________________________  RADIOLOGY  Wrist x-ray demonstrates nondisplaced fracture distal radial metaphysis Chest x-ray mild cardiomegaly ____________________________________________   PROCEDURES  Procedure(s) performed: No  Procedures   Critical Care performed: No ____________________________________________   INITIAL IMPRESSION / ASSESSMENT AND PLAN / ED COURSE  Pertinent labs & imaging results that were available during my care of the patient were reviewed by me and considered in my medical decision making (see chart for details).  Patient presents with syncopal episode after significant exertion.  Borderline blood pressure upon arrival.  No chest pain or palpitations.  EKG is reassuring.  We will give IV fluids, check labs, x-ray of the wrist and chest and reevaluate  Patient feeling much better after liter of IV fluid.  Wrist splint applied given wrist fracture.  2 troponins reassuring.  I will have the patient follow-up with  cardiology as she reports this is happened before.  She also need to follow-up with orthopedics given respiratory    ____________________________________________   FINAL CLINICAL IMPRESSION(S) / ED DIAGNOSES  Final diagnoses:  Syncope and collapse  Closed fracture of left wrist, initial encounter        Note:  This document was prepared using Dragon voice recognition software and may include unintentional dictation errors.   Lavonia Drafts, MD 06/18/19 1535

## 2019-06-18 NOTE — ED Notes (Signed)
Pt decided to walk to lobby. Pt states she is feeling good. Denies weakness or dizziness. Pt refused wheelchair.

## 2019-07-07 ENCOUNTER — Encounter: Payer: Self-pay | Admitting: Cardiology

## 2019-07-07 ENCOUNTER — Other Ambulatory Visit: Payer: Self-pay

## 2019-07-07 ENCOUNTER — Ambulatory Visit (INDEPENDENT_AMBULATORY_CARE_PROVIDER_SITE_OTHER): Admitting: Cardiology

## 2019-07-07 VITALS — BP 124/80 | HR 47 | Ht 64.0 in | Wt 162.0 lb

## 2019-07-07 DIAGNOSIS — R55 Syncope and collapse: Secondary | ICD-10-CM | POA: Diagnosis not present

## 2019-07-07 DIAGNOSIS — R001 Bradycardia, unspecified: Secondary | ICD-10-CM

## 2019-07-07 NOTE — Patient Instructions (Signed)
Medication Instructions:  - Your physician recommends that you continue on your current medications as directed. Please refer to the Current Medication list given to you today.  *If you need a refill on your cardiac medications before your next appointment, please call your pharmacy*  Lab Work: - none ordered  If you have labs (blood work) drawn today and your tests are completely normal, you will receive your results only by: . MyChart Message (if you have MyChart) OR . A paper copy in the mail If you have any lab test that is abnormal or we need to change your treatment, we will call you to review the results.  Testing/Procedures: - none ordered  Follow-Up: At [facility], you and your health needs are our priority.  As part of our continuing mission to provide you with exceptional heart care, we have created designated Provider Care Teams.  These Care Teams include your primary Cardiologist (physician) and Advanced Practice Providers (APPs -  Physician Assistants and Nurse Practitioners) who all work together to provide you with the care you need, when you need it.  Your next appointment:   as needed  The format for your next appointment:   n/a  Provider:   n/a  Other Instructions n/a  

## 2019-07-07 NOTE — Progress Notes (Signed)
Cardiology Office Note:    Date:  07/07/2019   ID:  Corynn Gorsky, DOB June 13, 1966, MRN [id number]  PCP:  [facility]  Cardiologist:  Kate Sable, MD  Electrophysiologist:  None   Referring MD: [place]   Chief Complaint  Patient presents with  . Other    [place] Follow up for syncope. Meds reviewed verbally with patient.     History of Present Illness:    Satvika Thielen is a 53 y.o. female with a hx of asthma, presents as a follow-up from the emergency department due to syncope.  About 3 weeks ago, patient was doing her usual run.  She ran for about 8 miles and then suddenly felt lightheaded.  She stopped running but symptoms persisted.  She knelt down and then suddenly fell on her side.  She waited a couple of minutes, stood up to walk and fell again injuring her left wrist.  She was in [place] neighborhood, a couple of neighbors saw her and she was brought to the emergency room. She denied shortness of breath chest pain or palpitations.  She presented to the emergency room where blood pressure was noted to be 83/4mmHg with a pulse of 47bpm.  High-sensitivity troponins were within normal limits.  EKG showed sinus bradycardia with heart rate 49.  Patient was given a liter of IV fluids with improvement in symptoms.  A splint was placed on her wrist after x-ray showed a nondisplaced radial fracture.  She was then advised to follow-up with cardiology.  Patient has fallen 4 times in the past year.  The first time was a year ago while she was in [place].  She states it was a hot day, she had gone for a run before, did not eat or drink, when she was stepping out of the train she suddenly passed out.  The second time was earlier this year, again after a run, she stepped out of the shower and passed out.  She states not eating or drinking after her run.  And then a third time she was on her way to work.  She ran before, did not eat or drink and passed out again.  She denies any  personal or family history of heart disease.  Orthostatic vital signs in the office today are as below;  HR BP SYMPTOMS  Lying  47  130/78  none  Sitting  52  122/74  none  Standing (85min)  61  105/65  none  Standing (17min)  67  113/72  none    Past Medical History:  Diagnosis Date  . Asthma   . Cancer [facility]) 2011   breast    Past Surgical History:  Procedure Laterality Date  . BREAST LUMPECTOMY Right    Lumpectomy  . IVAD removal    . LAPAROSCOPIC APPENDECTOMY N/A 05/25/2015   Procedure: APPENDECTOMY LAPAROSCOPIC;  Surgeon: Florene Glen, MD;  Location: [facility] ORS;  Service: General;  Laterality: N/A;    Current Medications: Current Meds  Medication Sig  . albuterol (PROVENTIL HFA;VENTOLIN HFA) 108 (90 BASE) MCG/ACT inhaler Inhale 2 puffs into the lungs every 4 (four) hours as needed for wheezing or shortness of breath.  b    Allergies:   Patient has no known allergies.   Social History   Socioeconomic History  . Marital status: Widowed    Spouse name: Not on file  . Number of children: Not on file  . Years of education: Not on file  . Highest education  level: Not on file  Occupational History  . Not on file  Social Needs  . Financial resource strain: Not on file  . Food insecurity    Worry: Not on file    Inability: Not on file  . Transportation needs    Medical: Not on file    Non-medical: Not on file  Tobacco Use  . Smoking status: Never Smoker  . Smokeless tobacco: Never Used  Substance and Sexual Activity  . Alcohol use: Yes    Comment: Occasional  . Drug use: No  . Sexual activity: Not on file  Lifestyle  . Physical activity    Days per week: Not on file    Minutes per session: Not on file  . Stress: Not on file  Relationships  . Social Herbalist on phone: Not on file    Gets together: Not on file    Attends religious service: Not on file    Active member of club or organization: Not on file    Attends meetings of clubs or  organizations: Not on file    Relationship status: Not on file  Other Topics Concern  . Not on file  Social History Narrative  . Not on file     Family History: The patient's family history includes Hypertension in her mother.  ROS:   Please see the history of present illness.     All other systems reviewed and are negative.  EKGs/Labs/Other Studies Reviewed:    The following studies were reviewed today:   EKG:  EKG is  ordered today.  The ekg ordered today demonstrates sinus bradycardia, heart rate 47  Recent Labs: 06/18/2019: ALT 15; BUN 19; Creatinine, Ser 1.21; Hemoglobin 11.4; Platelets 332; Potassium 4.6; Sodium 142  Recent Lipid Panel No results found for: CHOL, TRIG, HDL, CHOLHDL, VLDL, LDLCALC, LDLDIRECT  Physical Exam:    VS:  BP 124/80 (BP Location: Left Arm, Patient Position: Sitting, Cuff Size: Normal)   Pulse (!) 47   Ht 5\' 4"  (1.626 m)   Wt 162 lb (73.5 kg)   LMP 11/23/2016 Comment: neg upreg in er today   BMI 27.81 kg/m     Wt Readings from Last 3 Encounters:  07/07/19 162 lb (73.5 kg)  06/18/19 156 lb (70.8 kg)  09/17/18 147 lb (66.7 kg)     GEN:  Well nourished, well developed in no acute distress HEENT: Normal NECK: No JVD; No carotid bruits LYMPHATICS: No lymphadenopathy CARDIAC: Bradycardic, regular, no murmurs, rubs, gallops RESPIRATORY:  Clear to auscultation without rales, wheezing or rhonchi  ABDOMEN: Soft, non-tender, non-distended MUSCULOSKELETAL:  No edema; No deformity  SKIN: Warm and dry NEUROLOGIC:  Alert and oriented x 3 PSYCHIATRIC:  Normal affect   ASSESSMENT:   Patient with history of asthma who presents with syncope.  All her symptoms have occurred after running.  This suggests dehydration as possible etiology.  Her orthostatic vitals suggest orthostasis.  She does not have any cardiac symptoms to suggest a cardiac cause for syncope.  No further cardiac intervention or testing indicated at this time. 1. Syncope and collapse    2. Sinus bradycardia    PLAN:    In order of problems listed above:  1. Patient advised to stay hydrated before during and after running.  She was also encouraged to have a snack bar during her run. 2. Asymptomatic sinus bradycardia.  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  No intervention indicated.  Follow-up as needed or if symptoms  persist or get worse.   Medication Adjustments/Labs and Tests Ordered: Current medicines are reviewed at length with the patient today.  Concerns regarding medicines are outlined above.  Orders Placed This Encounter  Procedures  . EKG 12-Lead   No orders of the defined types were placed in this encounter.   Patient Instructions  Medication Instructions:  - Your physician recommends that you continue on your current medications as directed. Please refer to the Current Medication list given to you today.  *If you need a refill on your cardiac medications before your next appointment, please call your pharmacy*  Lab Work: - none ordered  If you have labs (blood work) drawn today and your tests are completely normal, you will receive your results only by: Marland Kitchen MyChart Message (if you have MyChart) OR . A paper copy in the mail If you have any lab test that is abnormal or we need to change your treatment, we will call you to review the results.  Testing/Procedures: - none ordered  Follow-Up: At [facility], you and your health needs are our priority.  As part of our continuing mission to provide you with exceptional heart care, we have created designated Provider Care Teams.  These Care Teams include your primary Cardiologist (physician) and Advanced Practice Providers (APPs -  Physician Assistants and Nurse Practitioners) who all work together to provide you with the care you need, when you need it.  Your next appointment:   as needed  The format for your next appointment:   n/a  Provider:   n/a  Other Instructions - n/a     Signed,  Kate Sable, MD  07/07/2019 9:25 AM    [place]

## 2020-07-16 ENCOUNTER — Emergency Department

## 2020-07-16 ENCOUNTER — Emergency Department
Admission: EM | Admit: 2020-07-16 | Discharge: 2020-07-16 | Disposition: A | Discharge status: Home / Self Care | Attending: Emergency Medicine | Admitting: Emergency Medicine

## 2020-07-16 ENCOUNTER — Other Ambulatory Visit: Payer: Self-pay

## 2020-07-16 ENCOUNTER — Encounter: Payer: Self-pay | Admitting: *Deleted

## 2020-07-16 DIAGNOSIS — J45909 Unspecified asthma, uncomplicated: Secondary | ICD-10-CM | POA: Insufficient documentation

## 2020-07-16 DIAGNOSIS — Z853 Personal history of malignant neoplasm of breast: Secondary | ICD-10-CM | POA: Insufficient documentation

## 2020-07-16 DIAGNOSIS — R059 Cough, unspecified: Secondary | ICD-10-CM | POA: Diagnosis present

## 2020-07-16 DIAGNOSIS — J209 Acute bronchitis, unspecified: Secondary | ICD-10-CM | POA: Insufficient documentation

## 2020-07-16 MED ORDER — IPRATROPIUM-ALBUTEROL 0.5-2.5 (3) MG/3ML IN SOLN
3.0000 mL | Freq: Once | RESPIRATORY_TRACT | Status: AC
  Administered 2020-07-16: 3 mL via RESPIRATORY_TRACT
  Filled 2020-07-16: qty 3

## 2020-07-16 MED ORDER — IPRATROPIUM-ALBUTEROL 0.5-2.5 (3) MG/3ML IN SOLN: 3.0000 mL | RESPIRATORY_TRACT | 3 refills | Status: AC | PRN

## 2020-07-16 MED ORDER — PREDNISONE 10 MG (21) PO TBPK: ORAL_TABLET | ORAL | 0 refills | Status: AC

## 2020-07-16 MED ORDER — AZITHROMYCIN 250 MG PO TABS: ORAL_TABLET | ORAL | 0 refills | Status: AC

## 2020-07-16 NOTE — ED Triage Notes (Signed)
Pt ambulatory to triage. Pt reports intermittent cough for 2 weeks  Hx asthma.  Pt using inhaler with some relief.  Pt alert.

## 2020-07-16 NOTE — Discharge Instructions (Signed)
Follow-up with your regular doctor if not improving in 3 to 4 days.  Return emergency department worsening.  Use medications as prescribed.  Use the DuoNeb Nebules in the nebulizer machine every 4-6 hours as needed.  If you are worsening you may need an additional Covid test.

## 2020-07-16 NOTE — ED Provider Notes (Signed)
[facility] Emergency Department Provider Note  ____________________________________________   First MD Initiated Contact with Patient 07/16/20 2222     (approximate)  I have reviewed the triage vital signs and the nursing notes.   HISTORY  Chief Complaint Cough    HPI Bridget Medina is a 54 y.o. female presents emergency department complaining of cough for 2 weeks.  History of asthma.  Patient had a negative Covid test at [facility].  States she has been using her inhaler more frequently.  She denies chest pain or shortness of breath at this time.  No known Covid exposures.    Past Medical History:  Diagnosis Date  . Asthma   . Cancer [facility]) 2011   breast    Patient Active Problem List   Diagnosis Date Noted  . MDD (major depressive disorder), recurrent episode, moderate ([place])   . Appendicitis with peritoneal abscess 05/25/2015  . Acute appendicitis with localized peritonitis   . Pelvic pain in female     Past Surgical History:  Procedure Laterality Date  . BREAST LUMPECTOMY Right    Lumpectomy  . IVAD removal    . LAPAROSCOPIC APPENDECTOMY N/A 05/25/2015   Procedure: APPENDECTOMY LAPAROSCOPIC;  Surgeon: Florene Glen, MD;  Location: [facility] ORS;  Service: General;  Laterality: N/A;    Prior to Admission medications   Medication Sig Start Date End Date Taking? Authorizing Provider  albuterol (PROVENTIL HFA;VENTOLIN HFA) 108 (90 BASE) MCG/ACT inhaler Inhale 2 puffs into the lungs every 4 (four) hours as needed for wheezing or shortness of breath.    [provider]  azithromycin (ZITHROMAX Z-PAK) 250 MG tablet 2 pills today then 1 pill a day for 4 days 07/16/20   Caryn Section, Linden Dolin, PA-C  ipratropium-albuterol (DUONEB) 0.5-2.5 (3) MG/3ML SOLN Take 3 mLs by nebulization every 4 (four) hours as needed. 07/16/20   Yannis Broce, Linden Dolin, PA-C  predniSONE (STERAPRED UNI-PAK 21 TAB) 10 MG (21) TBPK tablet Take 6 pills on day one then decrease by 1 pill  each day 07/16/20   Versie Starks, PA-C    Allergies Patient has no known allergies.  Family History  Problem Relation Age of Onset  . Hypertension Mother     Social History Social History   Tobacco Use  . Smoking status: Never Smoker  . Smokeless tobacco: Never Used  Substance Use Topics  . Alcohol use: Not Currently    Comment: Occasional  . Drug use: No    Review of Systems  Constitutional: No fever/chills Eyes: No visual changes. ENT: No sore throat. Respiratory: Positive cough Cardiovascular: Denies chest pain Gastrointestinal: Denies abdominal pain Genitourinary: Negative for dysuria. Musculoskeletal: Negative for back pain. Skin: Negative for rash. Psychiatric: no mood changes,     ____________________________________________   PHYSICAL EXAM:  VITAL SIGNS: ED Triage Vitals  Enc Vitals Group     BP 07/16/20 2156 135/81     Pulse Rate 07/16/20 2156 (!) 56     Resp 07/16/20 2156 20     Temp 07/16/20 2156 99.8 F (37.7 C)     Temp Source 07/16/20 2156 Oral     SpO2 07/16/20 2156 95 %     Weight 07/16/20 2158 143 lb (64.9 kg)     Height 07/16/20 2158 5\' 4"  (1.626 m)     Head Circumference --      Peak Flow --      Pain Score 07/16/20 2158 7     Pain Loc --  Pain Edu? --      Excl. in [place]? --     Constitutional: Alert and oriented. Well appearing and in no acute distress. Eyes: Conjunctivae are normal.  Head: Atraumatic. Nose: No congestion/rhinnorhea. Mouth/Throat: Mucous membranes are moist.   Neck:  supple no lymphadenopathy noted Cardiovascular: Normal rate, regular rhythm. Heart sounds are normal Respiratory: Normal respiratory effort.  No retractions, lungs with decreased breath sounds bilaterally, cough is tight and congested GU: deferred Musculoskeletal: FROM all extremities, warm and well perfused Neurologic:  Normal speech and language.  Skin:  Skin is warm, dry and intact. No rash noted. Psychiatric: Mood and affect are  normal. Speech and behavior are normal.  ____________________________________________   LABS (all labs ordered are listed, but only abnormal results are displayed)  Labs Reviewed - No data to display ____________________________________________   ____________________________________________  RADIOLOGY  Chest x-ray is normal  ____________________________________________   PROCEDURES  Procedure(s) performed: DuoNeb   Procedures    ____________________________________________   INITIAL IMPRESSION / ASSESSMENT AND PLAN / ED COURSE  Pertinent labs & imaging results that were available during my care of the patient were reviewed by me and considered in my medical decision making (see chart for details).   Patient is a 54 year old female presents with upper respiratory symptoms.  See HPI.  Physical exam shows patient appears stable.  Lungs with some decreased breath sounds bilaterally.  Chest x-ray does not show any pneumonia or abnormalities.  This was also reviewed by me  Did explain the findings to the patient.  She states she feels like she needs a breathing treatment.  I do agree with her she has decreased breath sounds with some wheezing in the lower lung field.  She was given a DuoNeb.  She is given a prescription for Z-Pak, steroid pack, DuoNeb Nebules and a nebulizer machine.  She was discharged in stable condition.     Bridget Medina was evaluated in Emergency Department on 07/16/2020 for the symptoms described in the history of present illness. She was evaluated in the context of the global COVID-19 pandemic, which necessitated consideration that the patient might be at risk for infection with the SARS-CoV-2 virus that causes COVID-19. Institutional protocols and algorithms that pertain to the evaluation of patients at risk for COVID-19 are in a state of rapid change based on information released by regulatory bodies including the CDC and federal and state  organizations. These policies and algorithms were followed during the patient's care in the ED.    As part of my medical decision making, I reviewed the following data within the [facility] notes reviewed and incorporated, Old chart reviewed, Radiograph reviewed , Notes from prior ED visits and [place] Controlled Substance Database  ____________________________________________   FINAL CLINICAL IMPRESSION(S) / ED DIAGNOSES  Final diagnoses:  Acute bronchitis, unspecified organism      NEW MEDICATIONS STARTED DURING THIS VISIT:  New Prescriptions   AZITHROMYCIN (ZITHROMAX Z-PAK) 250 MG TABLET    2 pills today then 1 pill a day for 4 days   IPRATROPIUM-ALBUTEROL (DUONEB) 0.5-2.5 (3) MG/3ML SOLN    Take 3 mLs by nebulization every 4 (four) hours as needed.   PREDNISONE (STERAPRED UNI-PAK 21 TAB) 10 MG (21) TBPK TABLET    Take 6 pills on day one then decrease by 1 pill each day     Note:  This document was prepared using Dragon voice recognition software and may include unintentional dictation errors.    Versie Starks,  PA-C 07/16/20 2245    Nance Pear, MD 07/16/20 [phone]

## 2020-07-29 DIAGNOSIS — J45909 Unspecified asthma, uncomplicated: Secondary | ICD-10-CM | POA: Diagnosis not present

## 2020-07-29 DIAGNOSIS — Z7951 Long term (current) use of inhaled steroids: Secondary | ICD-10-CM | POA: Diagnosis not present

## 2020-07-29 DIAGNOSIS — R0789 Other chest pain: Secondary | ICD-10-CM | POA: Diagnosis not present

## 2020-07-29 DIAGNOSIS — Z853 Personal history of malignant neoplasm of breast: Secondary | ICD-10-CM | POA: Diagnosis not present

## 2020-07-30 ENCOUNTER — Other Ambulatory Visit: Payer: Self-pay

## 2020-07-30 ENCOUNTER — Emergency Department
Admission: EM | Admit: 2020-07-30 | Discharge: 2020-07-30 | Disposition: A | Discharge status: Home / Self Care | Attending: Emergency Medicine | Admitting: Emergency Medicine

## 2020-07-30 ENCOUNTER — Emergency Department

## 2020-07-30 ENCOUNTER — Encounter: Payer: Self-pay | Admitting: Emergency Medicine

## 2020-07-30 DIAGNOSIS — R0789 Other chest pain: Secondary | ICD-10-CM

## 2020-07-30 LAB — CBC
HCT: 35.7 % — ABNORMAL LOW (ref 36.0–46.0)
Hemoglobin: 11.4 g/dL — ABNORMAL LOW (ref 12.0–15.0)
MCH: 20.6 pg — ABNORMAL LOW (ref 26.0–34.0)
MCHC: 31.9 g/dL (ref 30.0–36.0)
MCV: 64.6 fL — ABNORMAL LOW (ref 80.0–100.0)
Platelets: 310 10*3/uL (ref 150–400)
RBC: 5.53 MIL/uL — ABNORMAL HIGH (ref 3.87–5.11)
RDW: 14.7 % (ref 11.5–15.5)
WBC: 5.2 10*3/uL (ref 4.0–10.5)
nRBC: 0 % (ref 0.0–0.2)

## 2020-07-30 LAB — LIPASE, BLOOD: Lipase: 29 U/L (ref 11–51)

## 2020-07-30 LAB — BASIC METABOLIC PANEL
Anion gap: 7 (ref 5–15)
BUN: 20 mg/dL (ref 6–20)
CO2: 28 mmol/L (ref 22–32)
Calcium: 9.4 mg/dL (ref 8.9–10.3)
Chloride: 104 mmol/L (ref 98–111)
Creatinine, Ser: 0.7 mg/dL (ref 0.44–1.00)
GFR, Estimated: >60 mL/min (ref >60)
Glucose, Bld: 95 mg/dL (ref 70–99)
Potassium: 4.2 mmol/L (ref 3.5–5.1)
Sodium: 139 mmol/L (ref 135–145)

## 2020-07-30 LAB — HEPATIC FUNCTION PANEL
ALT: 15 U/L (ref 0–44)
AST: 20 U/L (ref 15–41)
Albumin: 3.6 g/dL (ref 3.5–5.0)
Alkaline Phosphatase: 59 U/L (ref 38–126)
Bilirubin, Direct: <0.1 mg/dL (ref 0.0–0.2)
Total Bilirubin: 0.6 mg/dL (ref 0.3–1.2)
Total Protein: 6.9 g/dL (ref 6.5–8.1)

## 2020-07-30 LAB — TROPONIN I (HIGH SENSITIVITY)
Troponin I (High Sensitivity): 4 ng/L (ref <18)
Troponin I (High Sensitivity): 4 ng/L (ref <18)

## 2020-07-30 LAB — FIBRIN DERIVATIVES D-DIMER (ARMC ONLY): Fibrin derivatives D-dimer (ARMC): 381.41 ng/mL (FEU) (ref 0.00–499.00)

## 2020-07-30 MED ORDER — CYCLOBENZAPRINE HCL 10 MG PO TABS
10.0000 mg | ORAL_TABLET | Freq: Once | ORAL | Status: AC
  Administered 2020-07-30: 10 mg via ORAL
  Filled 2020-07-30: qty 1

## 2020-07-30 MED ORDER — IBUPROFEN 800 MG PO TABS
800.0000 mg | ORAL_TABLET | Freq: Once | ORAL | Status: AC
  Administered 2020-07-30: 800 mg via ORAL
  Filled 2020-07-30: qty 1

## 2020-07-30 MED ORDER — CYCLOBENZAPRINE HCL 10 MG PO TABS: 10.0000 mg | ORAL_TABLET | Freq: Three times a day (TID) | ORAL | 0 refills | Status: AC | PRN

## 2020-07-30 MED ORDER — LIDOCAINE 5 % EX PTCH
1.0000 | MEDICATED_PATCH | CUTANEOUS | Status: DC
  Administered 2020-07-30: 1 via TRANSDERMAL
  Filled 2020-07-30: qty 1

## 2020-07-30 MED ORDER — IBUPROFEN 800 MG PO TABS: 800.0000 mg | ORAL_TABLET | Freq: Three times a day (TID) | ORAL | 0 refills | Status: AC | PRN

## 2020-07-30 NOTE — ED Provider Notes (Signed)
[facility] Emergency Department Provider Note  ____________________________________________  Time seen: Approximately 6:32 AM  I have reviewed the triage vital signs and the nursing notes.   HISTORY  Chief Complaint Chest Pain   HPI Bridget Medina is a 54 y.o. female with history of asthma  and remote breast cancer status post lumpectomy, radiation and chemotherapy in 2011 who presents for evaluation of chest wall pain.  Patient reports that the pain has been there for 2 weeks, constant, worse with movement of the torso or palpation of the right lower chest wall.  Started 2 weeks ago when she was diagnosed with bronchitis.  She was coughing and heard a pop on the right side of the chest.  Patient reports being treated for bronchitis with resolution of her cough but the chest pain has persisted.  She has never had a blood clot in her legs or her lungs, no leg pain or swelling, no hemoptysis or exogenous hormones.  No cough, no shortness of breath, no fever.  Past Medical History:  Diagnosis Date  . Asthma   . Cancer [facility]) 2011   breast    Patient Active Problem List   Diagnosis Date Noted  . MDD (major depressive disorder), recurrent episode, moderate ([place])   . Appendicitis with peritoneal abscess 05/25/2015  . Acute appendicitis with localized peritonitis   . Pelvic pain in female     Past Surgical History:  Procedure Laterality Date  . BREAST LUMPECTOMY Right    Lumpectomy  . IVAD removal    . LAPAROSCOPIC APPENDECTOMY N/A 05/25/2015   Procedure: APPENDECTOMY LAPAROSCOPIC;  Surgeon: Florene Glen, MD;  Location: [facility] ORS;  Service: General;  Laterality: N/A;    Prior to Admission medications   Medication Sig Start Date End Date Taking? Authorizing Provider  albuterol (PROVENTIL HFA;VENTOLIN HFA) 108 (90 BASE) MCG/ACT inhaler Inhale 2 puffs into the lungs every 4 (four) hours as needed for wheezing or shortness of breath.    [provider]  azithromycin (ZITHROMAX Z-PAK) 250 MG tablet 2 pills today then 1 pill a day for 4 days 07/16/20   Caryn Section Linden Dolin, PA-C  cyclobenzaprine (FLEXERIL) 10 MG tablet Take 1 tablet (10 mg total) by mouth 3 (three) times daily as needed for muscle spasms. 07/30/20   Rudene Re, MD  ibuprofen (ADVIL) 800 MG tablet Take 1 tablet (800 mg total) by mouth every 8 (eight) hours as needed. 07/30/20   Alfred Levins, [place], MD  ipratropium-albuterol (DUONEB) 0.5-2.5 (3) MG/3ML SOLN Take 3 mLs by nebulization every 4 (four) hours as needed. 07/16/20   Fisher, Linden Dolin, PA-C  predniSONE (STERAPRED UNI-PAK 21 TAB) 10 MG (21) TBPK tablet Take 6 pills on day one then decrease by 1 pill each day 07/16/20   Versie Starks, PA-C    Allergies Patient has no known allergies.  Family History  Problem Relation Age of Onset  . Hypertension Mother     Social History Social History   Tobacco Use  . Smoking status: Never Smoker  . Smokeless tobacco: Never Used  Substance Use Topics  . Alcohol use: Not Currently    Comment: Occasional  . Drug use: No    Review of Systems  Constitutional: Negative for fever. Eyes: Negative for visual changes. ENT: Negative for sore throat. Neck: No neck pain  Cardiovascular: Negative for chest pain. + chest wall pain Respiratory: Negative for shortness of breath. Gastrointestinal: Negative for abdominal pain, vomiting or diarrhea. Genitourinary: Negative for dysuria.  Musculoskeletal: Negative for back pain. Skin: Negative for rash. Neurological: Negative for headaches, weakness or numbness. Psych: No SI or HI  ____________________________________________   PHYSICAL EXAM:  VITAL SIGNS: ED Triage Vitals  Enc Vitals Group     BP 07/30/20 0009 133/74     Pulse Rate 07/30/20 0009 (!) 52     Resp 07/30/20 0009 14     Temp 07/30/20 0009 97.9 F (36.6 C)     Temp Source 07/30/20 0009 Oral     SpO2 07/30/20 0009 97 %     Weight 07/30/20 0010 145  lb (65.8 kg)     Height 07/30/20 0010 5\' 4"  (1.626 m)     Head Circumference --      Peak Flow --      Pain Score 07/30/20 0010 10     Pain Loc --      Pain Edu? --      Excl. in [place]? --     Constitutional: Alert and oriented. Well appearing and in no apparent distress. HEENT:      Head: Normocephalic and atraumatic.         Eyes: Conjunctivae are normal. Sclera is non-icteric.       Mouth/Throat: Mucous membranes are moist.       Neck: Supple with no signs of meningismus. Cardiovascular: Regular rate and rhythm. No murmurs, gallops, or rubs. 2+ symmetrical distal pulses are present in all extremities. No JVD.  Pain is reproducible with palpation of the right lower chest wall Respiratory: Normal respiratory effort. Lungs are clear to auscultation bilaterally. No wheezes, crackles, or rhonchi.  Gastrointestinal: Soft, non tender, and non distended. Musculoskeletal:  No edema, cyanosis, or erythema of extremities. Neurologic: Normal speech and language. Face is symmetric. Moving all extremities. No gross focal neurologic deficits are appreciated. Skin: Skin is warm, dry and intact. No rash noted. Psychiatric: Mood and affect are normal. Speech and behavior are normal.  ____________________________________________   LABS (all labs ordered are listed, but only abnormal results are displayed)  Labs Reviewed  CBC - Abnormal; Notable for the following components:      Result Value   RBC 5.53 (*)    Hemoglobin 11.4 (*)    HCT 35.7 (*)    MCV 64.6 (*)    MCH 20.6 (*)    All other components within normal limits  BASIC METABOLIC PANEL  FIBRIN DERIVATIVES D-DIMER ([facility] ONLY)  HEPATIC FUNCTION PANEL  LIPASE, BLOOD  POC URINE PREG, ED  TROPONIN I (HIGH SENSITIVITY)  TROPONIN I (HIGH SENSITIVITY)   ____________________________________________  EKG  ED ECG REPORT I, Rudene Re, the attending physician, personally viewed and interpreted this ECG.  Sinus bradycardia, rate of  49, normal intervals, diffuse T wave flattening with no ST elevations.  Unchanged when compared to prior from 2020. ____________________________________________  RADIOLOGY  I have personally reviewed the images performed during this visit and I agree with the Radiologist's read.   Interpretation by Radiologist:  DG Chest 2 View  Result Date: 07/30/2020 CLINICAL DATA:  Chest pain EXAM: CHEST - 2 VIEW COMPARISON:  None. FINDINGS: The heart size and mediastinal contours are within normal limits. Both lungs are clear. The visualized skeletal structures are unremarkable. IMPRESSION: No active cardiopulmonary disease. Electronically Signed   By: Prudencio Pair M.D.   On: 07/30/2020 00:35     ____________________________________________   PROCEDURES  Procedure(s) performed:yes .1-3 Lead EKG Interpretation Performed by: Rudene Re, MD Authorized by: Rudene Re, MD  Interpretation: non-specific     ECG rate assessment: bradycardic     Rhythm: sinus bradycardia     Ectopy: none     Critical Care performed:  None ____________________________________________   INITIAL IMPRESSION / ASSESSMENT AND PLAN / ED COURSE   54 y.o. female with history of asthma  and remote breast cancer status post lumpectomy, radiation and chemotherapy in 2011 who presents for evaluation of chest wall pain.  Patient with tenderness to palpation over the right lower chest wall.  No rash, lungs are clear to auscultation.  EKG with no acute ischemic changes.  Due to history of cancer D-dimer was sent to rule out a PE and that is negative.  Low suspicion with no tachypnea, tachycardia, hypoxia, and a negative D-dimer.  Troponin x2 - with no signs of myocarditis.  No leukocytosis.  Chest x-ray visualized by me with no infiltrate, no edema, no pneumothorax.  Most likely musculoskeletal in nature.  Will treat with ibuprofen and Flexeril.  Recommended close follow-up with primary care doctor and return to the  emergency room for worsening pain or shortness of breath.  Old medical records reviewed      _____________________________________________ Please note:  Patient was evaluated in Emergency Department today for the symptoms described in the history of present illness. Patient was evaluated in the context of the global COVID-19 pandemic, which necessitated consideration that the patient might be at risk for infection with the SARS-CoV-2 virus that causes COVID-19. Institutional protocols and algorithms that pertain to the evaluation of patients at risk for COVID-19 are in a state of rapid change based on information released by regulatory bodies including the CDC and federal and state organizations. These policies and algorithms were followed during the patient's care in the ED.  Some ED evaluations and interventions may be delayed as a result of limited staffing during the pandemic.   [place] Controlled Substance Database was reviewed by me. ____________________________________________   FINAL CLINICAL IMPRESSION(S) / ED DIAGNOSES   Final diagnoses:  Chest wall pain      NEW MEDICATIONS STARTED DURING THIS VISIT:  ED Discharge Orders         Ordered    ibuprofen (ADVIL) 800 MG tablet  Every 8 hours PRN        07/30/20 0634    cyclobenzaprine (FLEXERIL) 10 MG tablet  3 times daily PRN        07/30/20 2563           Note:  This document was prepared using Dragon voice recognition software and may include unintentional dictation errors.    Alfred Levins, [place], MD 07/30/20 [phone]

## 2020-07-30 NOTE — ED Notes (Signed)
Pt states she was diagnosed with bronchitis 2 weeks ago. Pt states she has been coughing and heard a "pop" in her right chest. Pt states she began to experience right sided chest pain under breast along rib line. Pt states pain is worse with movement and she is most comfortable sitting upright. Pt appears in no acute distress, but does appear slightly uncomfortable when moving and is self splinting right chest wall.

## 2020-07-30 NOTE — ED Triage Notes (Signed)
Pt to ED from home c/o right rib pain.  States has bronchitis and has been having productive strong cough for "a while".  Was seen here last week and given steroid packet, breathing treatment, and nebulizer at home with relief.  But now rib pain has gotten worse and more painful with deep breaths and movement.  Thinks she has a broken rib.

## 2022-02-02 IMAGING — CR DG CHEST 2V
1 series · 2 of 2 positions shown · non-contrast
Comparison: June 18, 2019

CLINICAL DATA: Cough x2 weeks.

EXAM:
CHEST - 2 VIEW

[Series 1: dg chest 2 view · 0.14mm/px · 2 of 2 slices shown]
[im 1/2]
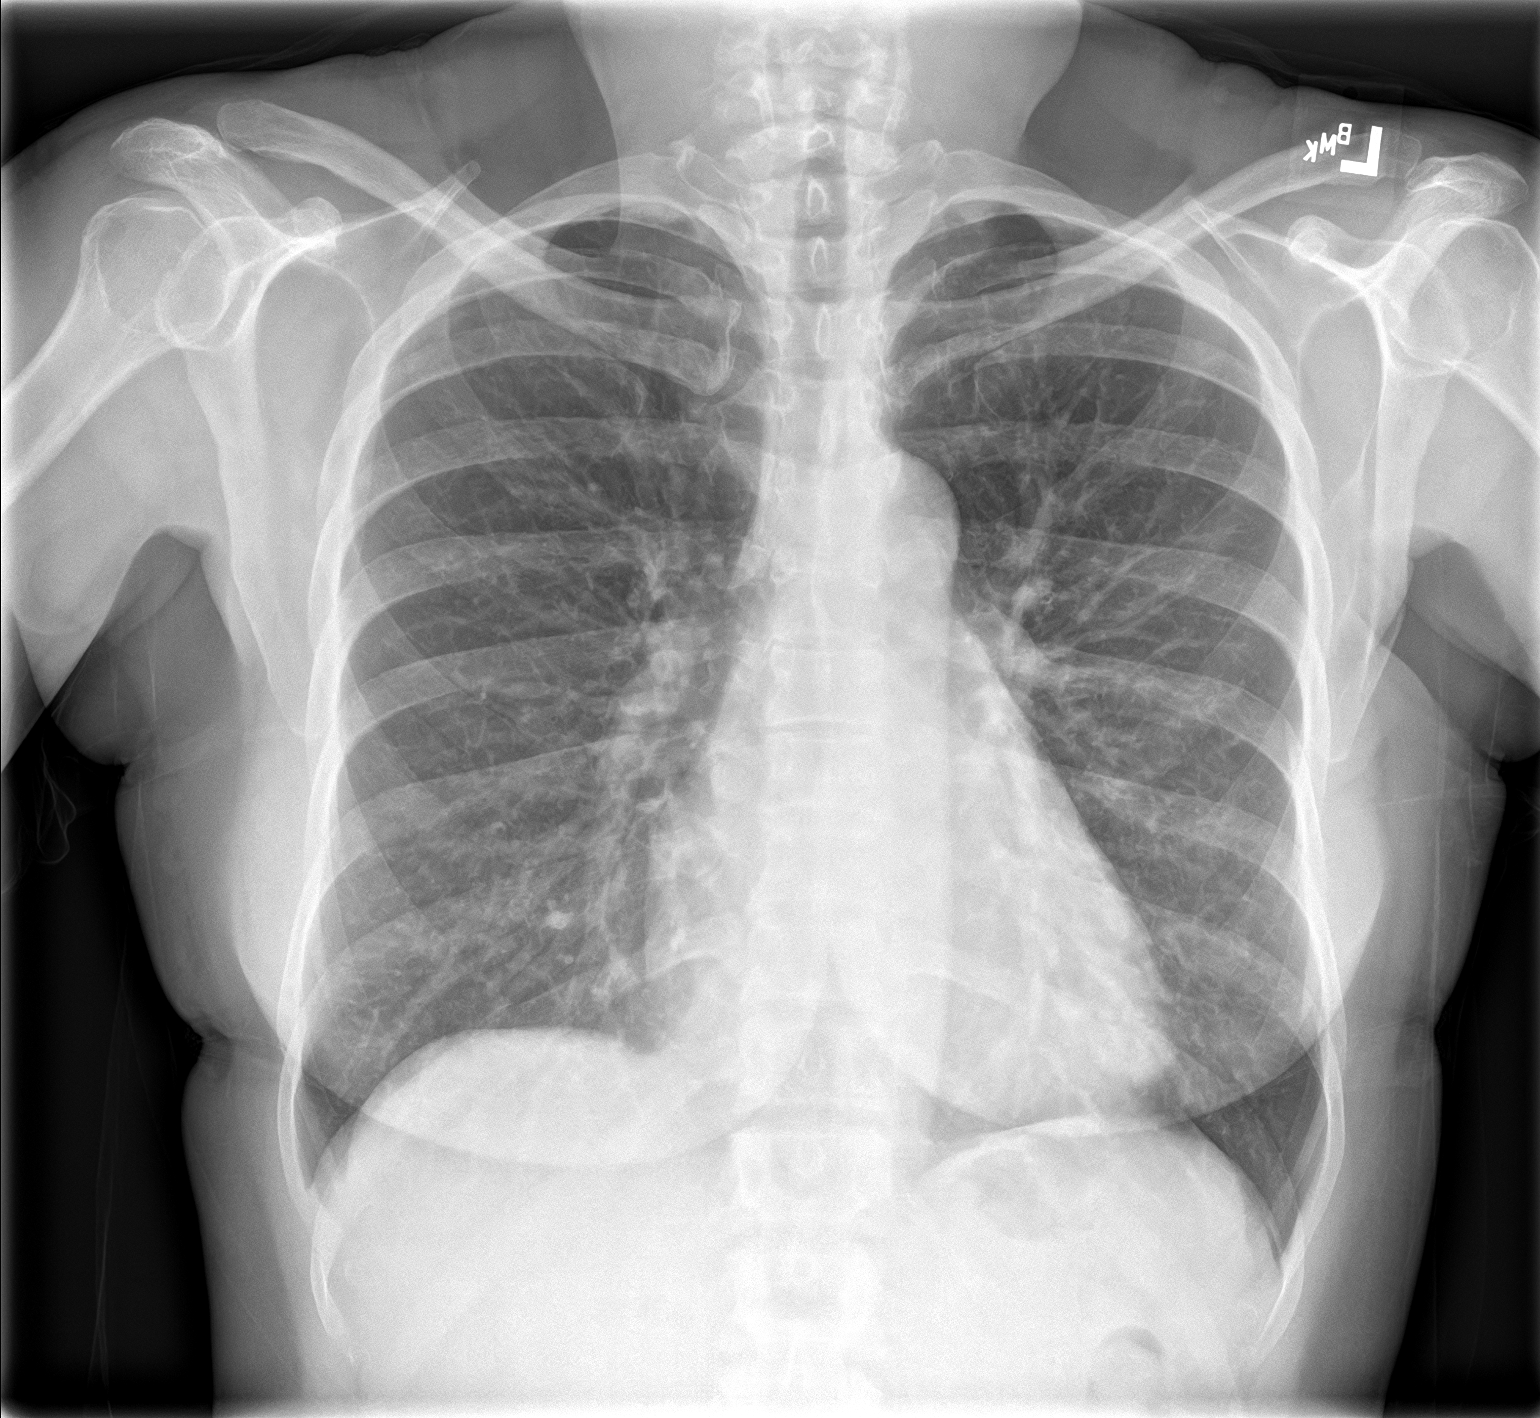
[im 2/2]
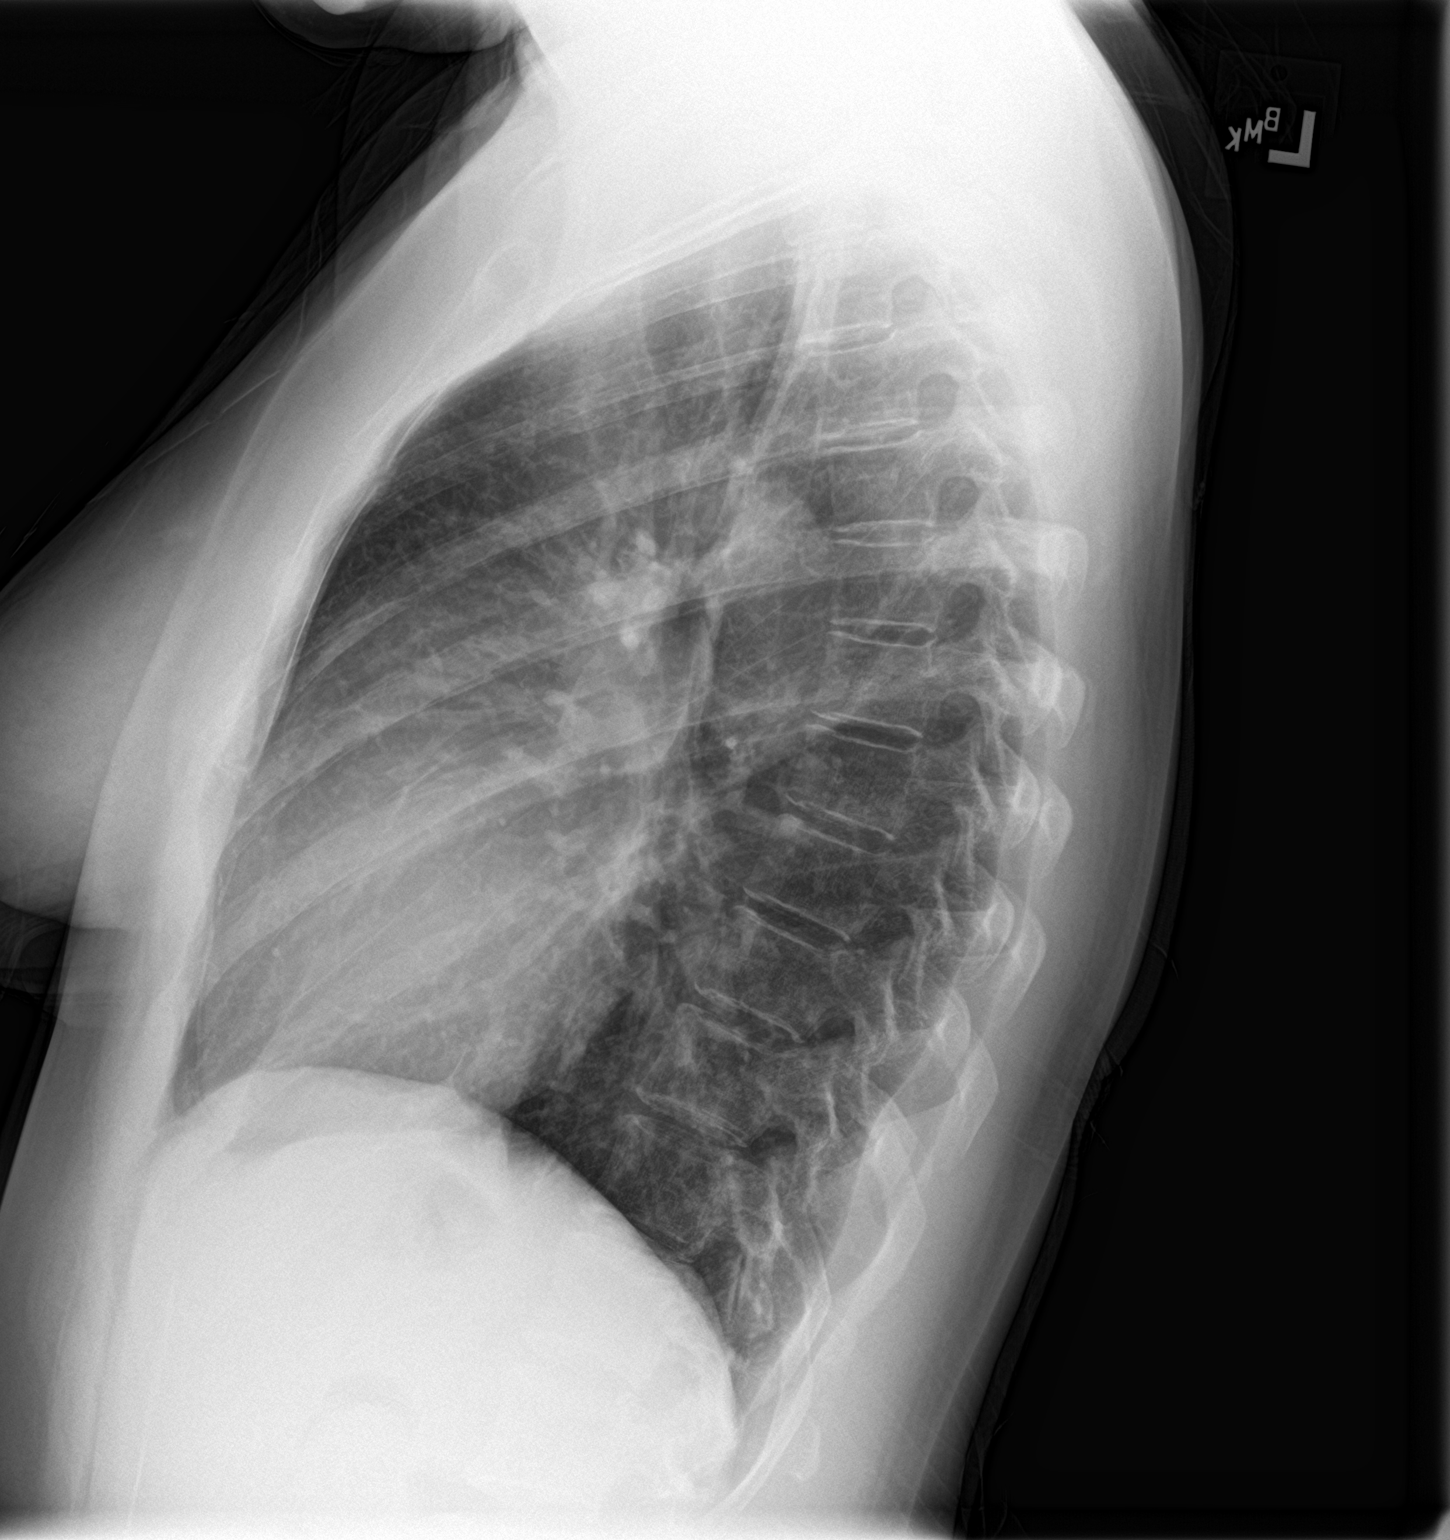

[2 of 2 positions shown; findings below may reference images not displayed]

FINDINGS: The heart size and mediastinal contours are within normal limits.
Both lungs are clear. The visualized skeletal structures are
unremarkable.
IMPRESSION: No active cardiopulmonary disease.
